# Patient Record
Sex: Male | Born: 1935 | Race: White | Hispanic: No | Marital: Married | Smoking: Former smoker
Health system: Southern US, Community
[De-identification: ages and names within clinical notes are randomized; demographics above are authoritative.]

## PROBLEM LIST (undated history)

## (undated) DIAGNOSIS — I2510 Atherosclerotic heart disease of native coronary artery without angina pectoris: Secondary | ICD-10-CM

## (undated) DIAGNOSIS — C61 Malignant neoplasm of prostate: Secondary | ICD-10-CM

## (undated) DIAGNOSIS — K279 Peptic ulcer, site unspecified, unspecified as acute or chronic, without hemorrhage or perforation: Secondary | ICD-10-CM

## (undated) DIAGNOSIS — F17201 Nicotine dependence, unspecified, in remission: Secondary | ICD-10-CM

## (undated) DIAGNOSIS — E785 Hyperlipidemia, unspecified: Secondary | ICD-10-CM

## (undated) DIAGNOSIS — J449 Chronic obstructive pulmonary disease, unspecified: Secondary | ICD-10-CM

## (undated) DIAGNOSIS — I10 Essential (primary) hypertension: Secondary | ICD-10-CM

## (undated) HISTORY — DX: Nicotine dependence, unspecified, in remission: F17.201

## (undated) HISTORY — DX: Essential (primary) hypertension: I10

## (undated) HISTORY — DX: Hyperlipidemia, unspecified: E78.5

## (undated) HISTORY — DX: Atherosclerotic heart disease of native coronary artery without angina pectoris: I25.10

## (undated) HISTORY — DX: Peptic ulcer, site unspecified, unspecified as acute or chronic, without hemorrhage or perforation: K27.9

## (undated) HISTORY — DX: Chronic obstructive pulmonary disease, unspecified: J44.9

## (undated) HISTORY — DX: Malignant neoplasm of prostate: C61

---

## 1968-03-10 DIAGNOSIS — K279 Peptic ulcer, site unspecified, unspecified as acute or chronic, without hemorrhage or perforation: Secondary | ICD-10-CM

## 1968-03-10 HISTORY — PX: REPAIR OF PERFORATED ULCER: SHX6065

## 1968-03-10 HISTORY — DX: Peptic ulcer, site unspecified, unspecified as acute or chronic, without hemorrhage or perforation: K27.9

## 1996-03-10 HISTORY — PX: CORONARY ARTERY BYPASS GRAFT: SHX141

## 2001-01-26 ENCOUNTER — Other Ambulatory Visit: Admission: RE | Admit: 2001-01-26 | Discharge: 2001-01-26 | Payer: Self-pay | Admitting: Urology

## 2001-03-10 DIAGNOSIS — C61 Malignant neoplasm of prostate: Secondary | ICD-10-CM

## 2001-03-10 HISTORY — DX: Malignant neoplasm of prostate: C61

## 2001-03-26 ENCOUNTER — Ambulatory Visit: Admission: RE | Admit: 2001-03-26 | Discharge: 2001-06-24 | Payer: Self-pay | Admitting: Radiation Oncology

## 2002-08-16 ENCOUNTER — Encounter: Payer: Self-pay | Admitting: Family Medicine

## 2002-08-16 ENCOUNTER — Ambulatory Visit (HOSPITAL_COMMUNITY): Admission: RE | Admit: 2002-08-16 | Discharge: 2002-08-16 | Payer: Self-pay | Admitting: Family Medicine

## 2002-08-22 ENCOUNTER — Ambulatory Visit (HOSPITAL_COMMUNITY): Admission: RE | Admit: 2002-08-22 | Discharge: 2002-08-22 | Payer: Self-pay | Admitting: Family Medicine

## 2002-08-22 ENCOUNTER — Encounter: Payer: Self-pay | Admitting: Family Medicine

## 2002-09-27 ENCOUNTER — Ambulatory Visit (HOSPITAL_COMMUNITY): Admission: RE | Admit: 2002-09-27 | Discharge: 2002-09-27 | Payer: Self-pay | Admitting: Internal Medicine

## 2002-09-27 HISTORY — PX: COLONOSCOPY: SHX174

## 2002-12-07 ENCOUNTER — Ambulatory Visit (HOSPITAL_COMMUNITY): Admission: RE | Admit: 2002-12-07 | Discharge: 2002-12-07 | Payer: Self-pay | Admitting: Family Medicine

## 2002-12-07 ENCOUNTER — Encounter: Payer: Self-pay | Admitting: Family Medicine

## 2003-05-09 ENCOUNTER — Ambulatory Visit (HOSPITAL_COMMUNITY): Admission: RE | Admit: 2003-05-09 | Discharge: 2003-05-09 | Payer: Self-pay | Admitting: Family Medicine

## 2003-05-24 ENCOUNTER — Encounter: Payer: Self-pay | Admitting: Cardiology

## 2003-05-26 ENCOUNTER — Ambulatory Visit (HOSPITAL_COMMUNITY): Admission: RE | Admit: 2003-05-26 | Discharge: 2003-05-26 | Payer: Self-pay | Admitting: *Deleted

## 2003-06-12 ENCOUNTER — Ambulatory Visit (HOSPITAL_COMMUNITY): Admission: RE | Admit: 2003-06-12 | Discharge: 2003-06-12 | Payer: Self-pay | Admitting: *Deleted

## 2003-06-23 ENCOUNTER — Encounter (HOSPITAL_COMMUNITY): Admission: RE | Admit: 2003-06-23 | Discharge: 2003-06-30 | Payer: Self-pay | Admitting: Family Medicine

## 2003-09-01 ENCOUNTER — Ambulatory Visit (HOSPITAL_COMMUNITY): Admission: RE | Admit: 2003-09-01 | Discharge: 2003-09-01 | Payer: Self-pay | Admitting: Family Medicine

## 2004-01-01 ENCOUNTER — Ambulatory Visit (HOSPITAL_COMMUNITY): Admission: RE | Admit: 2004-01-01 | Discharge: 2004-01-01 | Payer: Self-pay | Admitting: Family Medicine

## 2004-06-13 ENCOUNTER — Ambulatory Visit (HOSPITAL_COMMUNITY): Admission: RE | Admit: 2004-06-13 | Discharge: 2004-06-13 | Payer: Self-pay | Admitting: Family Medicine

## 2005-02-06 ENCOUNTER — Ambulatory Visit: Payer: Self-pay | Admitting: *Deleted

## 2005-02-20 ENCOUNTER — Ambulatory Visit: Payer: Self-pay | Admitting: Cardiology

## 2006-02-24 ENCOUNTER — Ambulatory Visit: Payer: Self-pay | Admitting: Cardiology

## 2007-03-15 ENCOUNTER — Ambulatory Visit: Payer: Self-pay | Admitting: Cardiology

## 2007-11-26 ENCOUNTER — Encounter: Payer: Self-pay | Admitting: Cardiology

## 2007-11-26 LAB — CONVERTED CEMR LAB
BUN: 11 mg/dL
CO2: 24 meq/L
Chloride: 101 meq/L
Cholesterol: 125 mg/dL
Creatinine, Ser: 0.76 mg/dL
Glucose, Bld: 101 mg/dL
HCT: 44.2 %
HDL: 37 mg/dL
Hemoglobin: 15.1 g/dL
LDL Cholesterol: 69 mg/dL
MCV: 91 fL
Platelets: 211 10*3/uL
Potassium: 5.2 meq/L
Sodium: 135 meq/L
Total Bilirubin: 0.8 mg/dL
Triglyceride fasting, serum: 95 mg/dL
WBC: 8.3 10*3/uL

## 2008-04-12 ENCOUNTER — Ambulatory Visit: Payer: Self-pay | Admitting: Cardiology

## 2008-05-01 ENCOUNTER — Emergency Department (HOSPITAL_COMMUNITY): Admission: EM | Admit: 2008-05-01 | Discharge: 2008-05-01 | Payer: Self-pay | Admitting: Emergency Medicine

## 2009-01-12 ENCOUNTER — Ambulatory Visit (HOSPITAL_COMMUNITY): Admission: RE | Admit: 2009-01-12 | Discharge: 2009-01-12 | Payer: Self-pay | Admitting: Family Medicine

## 2009-01-12 LAB — CONVERTED CEMR LAB
ALT: 19 units/L
AST: 22 units/L
Albumin: 4.4 g/dL
Alkaline Phosphatase: 83 units/L
BUN: 9 mg/dL
CO2: 23 meq/L
Calcium: 9.4 mg/dL
Chloride: 102 meq/L
Cholesterol: 133 mg/dL
Creatinine, Ser: 0.71 mg/dL
Glucose, Bld: 87 mg/dL
HCT: 45.1 %
HDL: 40 mg/dL
Hemoglobin: 14.7 g/dL
LDL Cholesterol: 71 mg/dL
MCV: 94.5 fL
Platelets: 259 10*3/uL
Potassium: 4.9 meq/L
Sodium: 139 meq/L
TSH: 2.324 microintl units/mL
Total Protein: 6.7 g/dL
Triglycerides: 110 mg/dL
WBC: 8.7 10*3/uL

## 2009-05-09 ENCOUNTER — Telehealth (INDEPENDENT_AMBULATORY_CARE_PROVIDER_SITE_OTHER): Payer: Self-pay

## 2009-05-21 ENCOUNTER — Encounter: Payer: Self-pay | Admitting: Cardiology

## 2009-05-21 DIAGNOSIS — J449 Chronic obstructive pulmonary disease, unspecified: Secondary | ICD-10-CM

## 2009-05-21 DIAGNOSIS — I2510 Atherosclerotic heart disease of native coronary artery without angina pectoris: Secondary | ICD-10-CM | POA: Insufficient documentation

## 2009-05-21 DIAGNOSIS — J4489 Other specified chronic obstructive pulmonary disease: Secondary | ICD-10-CM | POA: Insufficient documentation

## 2009-05-22 ENCOUNTER — Ambulatory Visit: Payer: Self-pay | Admitting: Cardiology

## 2010-01-16 ENCOUNTER — Encounter: Payer: Self-pay | Admitting: *Deleted

## 2010-01-16 LAB — CONVERTED CEMR LAB
ALT: 19 units/L
AST: 22 units/L
Albumin: 4.4 g/dL
Alkaline Phosphatase: 82 units/L
BUN: 9 mg/dL
Basophils Absolute: 0.1 10*3/uL
Basophils Relative: 1 %
CO2: 26 meq/L
Calcium: 10.1 mg/dL
Chloride: 104 meq/L
Cholesterol: 130 mg/dL
Creatinine, Ser: 0.8 mg/dL
Eosinophils Absolute: 0.6 10*3/uL
Eosinophils Relative: 7 %
GFR calc non Af Amer: >60 mL/min
Glomerular Filtration Rate, Af Am: >60 mL/min/{1.73_m2}
Glucose, Bld: 99 mg/dL
HCT: 44.1 %
HDL: 44 mg/dL
Hemoglobin: 14.5 g/dL
LDL Cholesterol: 69 mg/dL
Lymphocytes Relative: 19 %
Lymphs Abs: 1.6 10*3/uL
MCHC: 32.9 g/dL
MCV: 94 fL
Monocytes Absolute: 0.6 10*3/uL
Monocytes Relative: 7 %
Platelets: 262 10*3/uL
Potassium: 5.5 meq/L
RBC: 4.69 M/uL
RDW: 14.2 %
Sodium: 139 meq/L
Total Protein: 7 g/dL
Triglycerides: 83 mg/dL
WBC: 8.6 10*3/uL

## 2010-04-09 NOTE — Assessment & Plan Note (Signed)
Summary: 1 YR F/U PER CHECKOUT ON 04/12/08/TG  Medications Added METOPROLOL TARTRATE 100 MG TABS (METOPROLOL TARTRATE) take 1/2 tab two times a day LIPITOR 40 MG TABS (ATORVASTATIN CALCIUM) take 1 tab daily DAILY MULTI  TABS (MULTIPLE VITAMINS-MINERALS) take 1 tab daily ASPIR-LOW 81 MG TBEC (ASPIRIN) take 1 tab daily PROAIR HFA 108 (90 BASE) MCG/ACT AERS (ALBUTEROL SULFATE) uas as needed ADVAIR DISKUS 100-50 MCG/DOSE AEPB (FLUTICASONE-SALMETEROL) use as needed KLOR-CON 10 10 MEQ CR-TABS (POTASSIUM CHLORIDE) take prn FUROSEMIDE 20 MG TABS (FUROSEMIDE) take prn ALPRAZOLAM 0.5 MG TABS (ALPRAZOLAM) take as needed      Allergies Added: NKDA  Visit Type:  Follow-up Primary Provider:  Dr. Nobie Putnam   History of Present Illness: Return visit for this very pleasant 77 year old gentleman with coronary disease and multiple cardiovascular risk factors.  Over the past 12 months, he has developed no significant medical issues.  He has not been evaluated in the emergency department nor has he required hospitalization.  Routine laboratory studies have been performed in Dr. Geanie Logan office-we are seeking the results of those tests.  Mr. Tanzi experiences cold-induced asthma and thus is less active in the winter.  He denies chest discomfort or dyspnea outside of the times he is wheezing.  He has had no orthopnea nor PND.  He notes no pedal edema.   Current Medications (verified): 1)  Lisinopril 20 Mg Tabs (Lisinopril) .... Take 1 Tablet By Mouth Once Daily 2)  Metoprolol Tartrate 100 Mg Tabs (Metoprolol Tartrate) .... Take 1/2 Tab Two Times A Day 3)  Lipitor 40 Mg Tabs (Atorvastatin Calcium) .... Take 1 Tab Daily 4)  Daily Multi  Tabs (Multiple Vitamins-Minerals) .... Take 1 Tab Daily 5)  Aspir-Low 81 Mg Tbec (Aspirin) .... Take 1 Tab Daily 6)  Proair Hfa 108 (90 Base) Mcg/act Aers (Albuterol Sulfate) .... Uas As Needed 7)  Advair Diskus 100-50 Mcg/dose Aepb (Fluticasone-Salmeterol) .... Use As  Needed 8)  Klor-Con 10 10 Meq Cr-Tabs (Potassium Chloride) .... Take Prn 9)  Furosemide 20 Mg Tabs (Furosemide) .... Take Prn 10)  Alprazolam 0.5 Mg Tabs (Alprazolam) .... Take As Needed  Allergies (verified): No Known Drug Allergies  Past History:  PMH, FH, and Social History reviewed and updated.  Review of Systems  The patient denies anorexia, fever, weight loss, weight gain, vision loss, decreased hearing, hoarseness, chest pain, syncope, dyspnea on exertion, peripheral edema, prolonged cough, headaches, hemoptysis, abdominal pain, melena, and hematochezia.    Vital Signs:  Patient profile:   77 year old male Height:      67 inches Weight:      143 pounds BMI:     22.48 Pulse rate:   45 / minute BP sitting:   113 / 64  (right arm)  Vitals Entered By: Dreama Saa, CNA (May 22, 2009 2:10 PM)  Physical Exam  General:    Trim and well developed; no acute distress:   Neck-No JVD; no carotid bruits: Lungs-No tachypnea, no rales; no rhonchi; no wheezes: Cardiovascular-normal PMI; normal S1 and S2; S4 present Abdomen-BS normal; soft and non-tender without masses or organomegaly:  Musculoskeletal-No deformities, no cyanosis or clubbing: Neurologic-Normal cranial nerves; symmetric strength and tone:  Skin-Warm, dry, no significant lesions: Extremities-Nl distal pulses; no edema:     Impression & Recommendations:  Problem # 1:  ATHEROSCLEROTIC CARDIOVASCULAR DISEASE (ICD-429.2) No symptoms to suggest myocardial ischemia.  Management will continue to center on optimal control of risk factors.  Problem # 2:  HYPERLIPIDEMIA (ICD-272.4) The most recent lipid  profile available to me was performed 5 months ago and was excellent.  Total cholesterol was 133, triglycerides 110, HDL 40 and LDL 71.  A CBC and complete metabolic profile were also normal at that time as was TSH and PSA.  Problem # 3:  CHRONIC OBSTRUCTIVE PULMONARY DISEASE (ICD-496) Patient has a component of asthma  responsive to Advair.  He is encouraged to remain as active as possible, perhaps exercising indoors during cold weather.  He assures me that he will be much more active as the spring progresses.  I will reassess this nice gentleman in one year.  Patient Instructions: 1)  Your physician recommends that you schedule a follow-up appointment in: 1 YEAR

## 2010-04-09 NOTE — Progress Notes (Signed)
Summary: Refill   Phone Note Call from Patient   Caller: Patient Reason for Call: Refill Medication Summary of Call: pt wants refill for Lisinopril 20mg  once daily 90 day supply called to [facility] R[facility]/tg Initial call taken by: Raechel Ache [facility],  May 09, 2009 11:00 AM    Prescriptions: LISINOPRIL 20 MG TABS (LISINOPRIL) take 1 tablet by mouth once daily  #90 x 0   Entered by:   Larita Fife Via LPN   Authorized by:   Kathlen Brunswick, MD, [facility]   Signed by:   Larita Fife Via LPN on 91/47/8295   Method used:   Electronically to        [facility]  [address] (retail)       [address]       [place], [place]  [address]       Ph: [phone]       Fax: [phone]   RxID:   [id number]

## 2010-05-27 ENCOUNTER — Ambulatory Visit (INDEPENDENT_AMBULATORY_CARE_PROVIDER_SITE_OTHER): Payer: Medicare Other | Admitting: Cardiology

## 2010-05-27 ENCOUNTER — Encounter: Payer: Self-pay | Admitting: Cardiology

## 2010-05-27 ENCOUNTER — Encounter: Payer: Self-pay | Admitting: *Deleted

## 2010-05-27 DIAGNOSIS — E875 Hyperkalemia: Secondary | ICD-10-CM | POA: Insufficient documentation

## 2010-05-27 DIAGNOSIS — I498 Other specified cardiac arrhythmias: Secondary | ICD-10-CM

## 2010-05-27 DIAGNOSIS — I2510 Atherosclerotic heart disease of native coronary artery without angina pectoris: Secondary | ICD-10-CM

## 2010-05-27 DIAGNOSIS — I10 Essential (primary) hypertension: Secondary | ICD-10-CM

## 2010-05-27 DIAGNOSIS — E782 Mixed hyperlipidemia: Secondary | ICD-10-CM

## 2010-05-28 ENCOUNTER — Ambulatory Visit (INDEPENDENT_AMBULATORY_CARE_PROVIDER_SITE_OTHER): Payer: Medicare Other | Admitting: Urology

## 2010-05-28 DIAGNOSIS — C61 Malignant neoplasm of prostate: Secondary | ICD-10-CM

## 2010-05-28 DIAGNOSIS — R310 Gross hematuria: Secondary | ICD-10-CM

## 2010-05-28 DIAGNOSIS — N3000 Acute cystitis without hematuria: Secondary | ICD-10-CM

## 2010-05-28 DIAGNOSIS — N433 Hydrocele, unspecified: Secondary | ICD-10-CM

## 2010-06-06 NOTE — Letter (Signed)
Summary: [facility], [facility] at [employer]  [address] [place] [address]   Phone: [phone]  Fax: [phone]     May 27, 2010 MRN: [id number]   [facility] [address] [place], [place]  [address]      YOUR LAB WORK IS DUE   June 27, 2010  Please go to [facility], located across the street from [facility] on the second floor.  Hours are Monday - Friday 7am until 7:30pm         Saturday 8am until 12noon      _X_ YOUR LABWORK IS NOT FASTING --YOU MAY EAT PRIOR TO LABWORK

## 2010-06-06 NOTE — Assessment & Plan Note (Signed)
Summary: 1 YEAR F/U PER PT CHECK OUT/TMJ/TR  Medications Added METOPROLOL TARTRATE 25 MG TABS (METOPROLOL TARTRATE) Take one tablet by mouth twice a day      Allergies Added: NKDA  Visit Type:  Follow-up Primary Provider:  Dr. Karleen Hampshire   History of Present Illness: Micheal Hawkins returns to the office for continued assessment and treatment of multiple cardiovascular risk factors.  He has done well over the past year with good exercise tolerance and no cardiopulmonary symptoms.  He has had no new medical problems, no emergency department visits and no hospitalizations.  He has been told of the presence of cataracts and the possible need for surgery in the near future.  Current Medications (verified): 1)  Lisinopril 20 Mg Tabs (Lisinopril) .... Take 1 Tablet By Mouth Once Daily 2)  Metoprolol Tartrate 25 Mg Tabs (Metoprolol Tartrate) .... Take One Tablet By Mouth Twice A Day 3)  Lipitor 40 Mg Tabs (Atorvastatin Calcium) .... Take 1 Tab Daily 4)  Daily Multi  Tabs (Multiple Vitamins-Minerals) .... Take 1 Tab Daily 5)  Aspir-Low 81 Mg Tbec (Aspirin) .... Take 1 Tab Daily 6)  Proair Hfa 108 (90 Base) Mcg/act Aers (Albuterol Sulfate) .... Uas As Needed 7)  Advair Diskus 100-50 Mcg/dose Aepb (Fluticasone-Salmeterol) .... Use As Needed 8)  Klor-Con 10 10 Meq Cr-Tabs (Potassium Chloride) .... Take Prn 9)  Furosemide 20 Mg Tabs (Furosemide) .... Take Prn 10)  Alprazolam 0.5 Mg Tabs (Alprazolam) .... Take As Needed  Allergies (verified): No Known Drug Allergies  Comments:  Nurse/Medical Assistant: patient brought med list we reviewed [facility] in [place]  Past History:  PMH, FH, and Social History reviewed and updated.  Review of Systems  The patient denies weight loss, weight gain, hoarseness, chest pain, syncope, dyspnea on exertion, peripheral edema, prolonged cough, headaches, and abdominal pain.    Vital Signs:  Patient profile:   77 year old male Weight:      145  pounds BMI:     22.79 O2 Sat:      92 % on Room air Pulse rate:   48 / minute BP sitting:   114 / 61  (left arm)  Vitals Entered By: Dreama Saa, CNA (May 27, 2010 12:59 PM)  O2 Flow:  Room air  Physical Exam  General:    Trim and well developed; no acute distress:   Neck-No JVD; no carotid bruits: Lungs-No tachypnea, no rales; no rhonchi; no wheezes: Cardiovascular-normal PMI; normal S1 and S2; S4 present; regular rhythm with bradycardia Abdomen-BS normal; soft and non-tender without masses or organomegaly:  Musculoskeletal-No deformities, no cyanosis or clubbing: Neurologic-Normal cranial nerves; symmetric strength and tone:  Skin-Warm, dry, no significant lesions: Extremities-Nl distal pulses; no edema:     EKG  Procedure date:  05/27/2010  Findings:      Rhythm Strip  Sinus bradycardia at a rate of 46 bpm Sinus arrhythmia   Impression & Recommendations:  Problem # 1:  ATHEROSCLEROTIC CARDIOVASCULAR DISEASE (ICD-429.2) Patient is doing very well with respect to coronary disease, now more than 20 years post CABG surgery.  We will continue what has been a winning strategy of maximizing medical management of cardiovascular risk factors.  Due to the presence of fairly profound sinus bradycardia, dose of metoprolol will be halved.  If bradycardia persists, this medication can be discontinued entirely.  Problem # 2:  HYPERLIPIDEMIA (ICD-272.4) Lipid profile is excellent; current medication will be continued.  CHOL: 130 (01/16/2010)   LDL: 69 (01/16/2010)  HDL: 44 (01/16/2010)   TG: 83 (01/16/2010)  Problem # 3:  HYPERKALEMIA (ICD-276.7) Recent potassium was 5.5.  While this may be a lab error or related to hemolysis, a low potassium diet will be recommended and a chemistry profile reassessed in one month.  I will see this nice gentleman again in one year.  Other Orders: Future Orders: T-Basic Metabolic Panel [phone]) ... 06/27/2010  Patient  Instructions: 1)  Your physician recommends that you schedule a follow-up appointment in: 1 YEAR 2)  Your physician recommends that you return for lab work in: 1 MONTH 3)  Your physician has recommended you make the following change in your medication: DECREASE METOPROLOL TO 25MG  TWICE DAILY  4)  Your physician has requested that you decrease the amount of potassium in your diet. Please see [facility] handout. Prescriptions: METOPROLOL TARTRATE 25 MG TABS (METOPROLOL TARTRATE) Take one tablet by mouth twice a day  #60 x 3   Entered by:   Teressa Lower RN   Authorized by:   Kathlen Brunswick, MD, [facility]   Signed by:   Teressa Lower RN on 05/27/2010   Method used:   Electronically to        [facility]  [address] (retail)       [address]       [place], [place]  [address]       Ph: [phone]       Fax: [phone]   RxID:   [id number]

## 2010-06-06 NOTE — Miscellaneous (Signed)
Summary: labs cmp,cbcd,lipids,01/16/2010  Clinical Lists Changes  Observations: Added new observation of CALCIUM: 10.1 mg/dL (16/12/9602 5:40) Added new observation of GFR AA: >60 mL/min/1.55m2 (01/16/2010 7:15) Added new observation of GFR: >60 mL/min (01/16/2010 7:15) Added new observation of LDL: 69 mg/dL (98/01/9146 8:29) Added new observation of HDL: 44 mg/dL (56/21/3086 5:78) Added new observation of TRIGLYC TOT: 83 mg/dL (46/96/2952 8:41) Added new observation of CHOLESTEROL: 130 mg/dL (32/44/0102 7:25) Added new observation of ALBUMIN: 4.4 g/dL (36/64/4034 7:42) Added new observation of PROTEIN, TOT: 7.0 g/dL (59/56/3875 6:43) Added new observation of SGPT (ALT): 19 units/L (01/16/2010 7:15) Added new observation of SGOT (AST): 22 units/L (01/16/2010 7:15) Added new observation of ALK PHOS: 82 units/L (01/16/2010 7:15) Added new observation of CREATININE: 0.80 mg/dL (32/95/1884 1:66) Added new observation of BUN: 9 mg/dL (09/07/1599 0:93) Added new observation of BG RANDOM: 99 mg/dL (23/55/7322 0:25) Added new observation of CO2 PLSM/SER: 26 meq/L (01/16/2010 7:15) Added new observation of CL SERUM: 104 meq/L (01/16/2010 7:15) Added new observation of K SERUM: 5.5 meq/L (01/16/2010 7:15) Added new observation of NA: 139 meq/L (01/16/2010 7:15) Added new observation of ABSOLUTE BAS: 0.1 K/uL (01/16/2010 7:15) Added new observation of BASOPHIL %: 1 % (01/16/2010 7:15) Added new observation of EOS ABSLT: 0.6 K/uL (01/16/2010 7:15) Added new observation of % EOS AUTO: 7 % (01/16/2010 7:15) Added new observation of ABSOLUTE MON: 0.6 K/uL (01/16/2010 7:15) Added new observation of MONOCYTE %: 7 % (01/16/2010 7:15) Added new observation of ABS LYMPHOCY: 1.6 K/uL (01/16/2010 7:15) Added new observation of LYMPHS %: 19 % (01/16/2010 7:15) Added new observation of PLATELETK/UL: 262 K/uL (01/16/2010 7:15) Added new observation of RDW: 14.2 % (01/16/2010 7:15) Added new observation of  MCHC RBC: 32.9 g/dL (42/70/6237 6:28) Added new observation of MCV: 94.0 fL (01/16/2010 7:15) Added new observation of HCT: 44.1 % (01/16/2010 7:15) Added new observation of HGB: 14.5 g/dL (31/51/7616 0:73) Added new observation of RBC M/UL: 4.69 M/uL (01/16/2010 7:15) Added new observation of WBC COUNT: 8.6 10*3/microliter (01/16/2010 7:15)

## 2010-06-27 ENCOUNTER — Other Ambulatory Visit: Payer: Self-pay | Admitting: Cardiology

## 2010-06-27 LAB — BASIC METABOLIC PANEL
BUN: 10 mg/dL (ref 6–23)
CO2: 24 mEq/L (ref 19–32)
Calcium: 10.2 mg/dL (ref 8.4–10.5)
Chloride: 104 mEq/L (ref 96–112)
Creat: 0.75 mg/dL (ref 0.40–1.50)
Glucose, Bld: 103 mg/dL — ABNORMAL HIGH (ref 70–99)
Potassium: 5.7 mEq/L — ABNORMAL HIGH (ref 3.5–5.3)
Sodium: 139 mEq/L (ref 135–145)

## 2010-07-02 ENCOUNTER — Encounter (HOSPITAL_COMMUNITY): Payer: Medicare Other

## 2010-07-02 ENCOUNTER — Other Ambulatory Visit: Payer: Self-pay | Admitting: Ophthalmology

## 2010-07-02 LAB — BASIC METABOLIC PANEL
BUN: 8 mg/dL (ref 6–23)
CO2: 27 mEq/L (ref 19–32)
Calcium: 9 mg/dL (ref 8.4–10.5)
Chloride: 102 mEq/L (ref 96–112)
Creatinine, Ser: 0.69 mg/dL (ref 0.4–1.5)
GFR calc Af Amer: >60 mL/min (ref >60)
GFR calc non Af Amer: >60 mL/min (ref >60)
Glucose, Bld: 101 mg/dL — ABNORMAL HIGH (ref 70–99)
Potassium: 4.5 mEq/L (ref 3.5–5.1)
Sodium: 134 mEq/L — ABNORMAL LOW (ref 135–145)

## 2010-07-02 LAB — HEMOGLOBIN AND HEMATOCRIT, BLOOD
HCT: 39.6 % (ref 39.0–52.0)
Hemoglobin: 13.4 g/dL (ref 13.0–17.0)

## 2010-07-08 ENCOUNTER — Ambulatory Visit (HOSPITAL_COMMUNITY)
Admission: RE | Admit: 2010-07-08 | Discharge: 2010-07-08 | Disposition: A | Discharge status: Home / Self Care | Payer: Medicare Other | Source: Ambulatory Visit | Attending: Ophthalmology | Admitting: Ophthalmology

## 2010-07-08 DIAGNOSIS — H2510 Age-related nuclear cataract, unspecified eye: Secondary | ICD-10-CM | POA: Insufficient documentation

## 2010-07-08 DIAGNOSIS — Z79899 Other long term (current) drug therapy: Secondary | ICD-10-CM | POA: Insufficient documentation

## 2010-07-08 DIAGNOSIS — Z7982 Long term (current) use of aspirin: Secondary | ICD-10-CM | POA: Insufficient documentation

## 2010-07-08 DIAGNOSIS — Z951 Presence of aortocoronary bypass graft: Secondary | ICD-10-CM | POA: Insufficient documentation

## 2010-07-08 DIAGNOSIS — J4489 Other specified chronic obstructive pulmonary disease: Secondary | ICD-10-CM | POA: Insufficient documentation

## 2010-07-08 DIAGNOSIS — I10 Essential (primary) hypertension: Secondary | ICD-10-CM | POA: Insufficient documentation

## 2010-07-08 DIAGNOSIS — J449 Chronic obstructive pulmonary disease, unspecified: Secondary | ICD-10-CM | POA: Insufficient documentation

## 2010-07-09 ENCOUNTER — Telehealth: Payer: Self-pay | Admitting: *Deleted

## 2010-07-09 ENCOUNTER — Encounter: Payer: Self-pay | Admitting: *Deleted

## 2010-07-09 DIAGNOSIS — E875 Hyperkalemia: Secondary | ICD-10-CM

## 2010-07-09 MED ORDER — LISINOPRIL 5 MG PO TABS: 5.0000 mg | ORAL_TABLET | Freq: Every day | ORAL | Status: DC

## 2010-07-09 NOTE — Telephone Encounter (Signed)
I spoke with pt and spouse ,pt is HOH, gave instructions on medicaitons changes, labs ordered , pt and spouse  Verbalized understanding

## 2010-07-09 NOTE — Telephone Encounter (Signed)
Message copied by Teressa Lower on Tue Jul 09, 2010  8:39 AM ------      Message from: Elida Bing      Created: Thu Jul 04, 2010 10:56 PM       Patient had significant hyperkalemia, which has been progressive over the last few years.      He is to take none of the potassium supplement that he was previously prescribed.      Decrease lisinopril to 5 mg q.d.      Patient to monitor blood pressure and report repeated or significant elevations above 140 systolic or 90 diastolic.      Repeat BMet in one month.

## 2010-07-23 NOTE — Letter (Signed)
April 12, 2008    Patrica Duel, MD  [address]  [place], [place] [address]   RE:  Micheal, Hawkins  MRN:  [id number]  /  DOB:  14-Feb-1933   Dear Loraine Leriche,   Micheal Hawkins returns to the office for continued assessment and  treatment of coronary artery disease and cardiovascular risk factors.  Since his last visit, he has done superbly.  He does all of his yard  work and tree work during the summer and did some snow shoveling during  our recent storm.  He has no dyspnea nor chest discomfort.  He also  stays busy with craft work, fabricating small items that he typically  gives away.   CURRENT MEDICATIONS:  1. Aspirin 81 mg daily.  2. Metoprolol 50 mg b.i.d.  3. Atorvastatin 40 mg daily.  4. Lisinopril 20 mg daily.  5. He uses albuterol and Advair on a p.r.n. basis.   PHYSICAL EXAMINATION:  GENERAL:  Trim, pleasant gentleman who is  obviously hard of hearing and in no acute distress.  VITAL SIGNS:  The weight is 145, unchanged.  Blood pressure 100/60,  heart rate 55 and regular, respirations 14.  NECK:  No jugular venous distention; no carotid bruits.  LUNGS:  Clear.  CARDIAC:  Normal first and second heart sounds, best heard in the  epigastrium; fourth heart sound present.  ABDOMEN:  Soft and nontender; no masses; no organomegaly.  EXTREMITIES:  Distal pulses intact; no edema.   EKG:  Sinus bradycardia with a single PVC; left atrial abnormality;  delayed R-wave progression; nonspecific T-wave abnormality.  Comparison  with prior tracing of May 24, 2003:  No significant interval change.   LABORATORY DATA:  The reports of recent laboratory studies were  obtained.  CBC, chemistry profile, PSA, and LFTs were normal.  Hepatic  profile was excellent with a total cholesterol of 125, triglycerides of  95, HDL 37, and LDL of 69.   IMPRESSION:  Micheal Hawkins continues to do well, now 11 years following  CABG surgery.  He will continue his current medication.  I  suggested he  also try fish oil on a b.i.d. basis.  I will see him again in 1 year.    Sincerely,      Gerrit Friends. Dietrich Pates, MD, [facility]  Electronically Signed    RMR/MedQ  DD: 04/12/2008  DT: 04/13/2008  Job #: [id number]

## 2010-07-23 NOTE — Letter (Signed)
March 15, 2007    Patrica Duel, M.D.  [address]  [place], [place] [address]   RE:  CHAIM, GATLEY  MRN:  [id number]  /  DOB:  [birth year]   Dear Loraine Leriche:   Mr. Dwan returns the office for continued assessment and treatment  of coronary disease and cardiovascular risk factors.  Since last visit  slightly more than year ago, he has done well from a cardiac standpoint.  A few months ago, he developed increased wheezing prompting  intensification of his therapy for asthma.  He uses Advair on an  essentially p.r.n. basis, which has worked well for him.   OTHER MEDICATIONS:  1. Include metoprolol 50 mg b.i.d.  2. Atorvastatin 40 mg daily.  3. Lisinopril 20 mg daily.  4. Aspirin 325 mg daily.   EXAM:  Trim pleasant gentleman in no acute distress.  Weight is 145, 6 pounds less than in December 2007.  Blood pressure  100/60, heart rate 64 and regular, respirations 18.  NECK:  No jugular venous distention; normal carotid upstrokes without  bruits.  LUNGS:  Clear with slightly prolonged expiratory phase.  CARDIAC:  Normal first and second heart sounds; fourth heart sound  present; normal PMI.  ABDOMEN:  Soft and nontender; normal bowel sounds; no organomegaly.  EXTREMITIES:  Normal distal pulses; no edema.   Recent laboratory from your office was excellent including a normal  chemistry profile, normal CBC, and total cholesterol 129 with HDL 38 and  LDL of 72.   IMPRESSION:  Mr. Baig is doing very well, now 10 years following  coronary artery bypass grafting surgery.  I suggested that he could  reduce the aspirin to 81 mg daily.  He is being treated with samples of  atorvastatin, but should his supply be exhausted, he could switch to  simvastatin for reduced expense.  Otherwise, medical therapy is optimal.  I will plan see this nice gentleman again in 1 year.    Sincerely,      Gerrit Friends. Dietrich Pates, MD, [facility]  Electronically Signed    RMR/MedQ  DD:  03/15/2007  DT: 03/15/2007  Job #: [id number]

## 2010-07-26 NOTE — Op Note (Signed)
   NAMEDARREK, LEASURE                        ACCOUNT NO.:  [id number]   MEDICAL RECORD NO.:  [id number]                   PATIENT TYPE:  AMB   LOCATION:  DAY                                  FACILITY:  [facility]   PHYSICIAN:  R. Roetta Sessions, M.D.              DATE OF BIRTH:  [birth year]   DATE OF PROCEDURE:  09/27/2002  DATE OF DISCHARGE:                                 OPERATIVE REPORT   PROCEDURE:  Screening colonoscopy.   INDICATIONS FOR PROCEDURE:  The patient is a 77 year old gentleman devoid of  any lower GI tract symptoms and with no family history of colorectal  neoplasia.  He has never had his colon imaged.  He was sent over by the  courtesy of Dr. Patrica Duel for screening colonoscopy.  This approach has  been discussed with the patient at length.  The potential risks, benefits,  and alternatives have been reviewed and questions answered.  He is  agreeable.  Please see my handwritten H&P for more information.   PROCEDURE:  O2 saturation, blood pressure, pulses, and respirations were  monitored throughout the entire procedure.  Conscious sedation was with  Versed 2 mg IV, Demerol 50 mg IV.  The instrument used was the Olympus video  chip adult colonoscope.   FINDINGS:  Digital rectal examination revealed no abnormalities.   ENDOSCOPIC FINDINGS:  The prep was good.   Rectum:  Examination of the rectal mucosa including retroflex view of the  anal verge revealed no abnormalities.   Colon:  The colonic mucosa was surveyed from the rectosigmoid junction  through the left, transverse, right colon to the area of the appendiceal  orifice, ileocecal valve, and cecum.  These structures were well-seen and  photographed for the record.  The colonic mucosa appeared normal to the  cecum.  From the level of the cecum and ileocecal valve, the scope was  slowly withdrawn.  All previously mentioned mucosal surfaces were again  seen.  No other abnormalities were observed.  The patient  tolerated the  procedure well and was reactive in endoscopy.    IMPRESSION:  1. Normal rectum.  2. Normal colon.   RECOMMENDATIONS:  Repeat colonoscopy in 10 years.                                               Jonathon Bellows, M.D.    RMR/MEDQ  D:  09/27/2002  T:  09/27/2002  Job:  [id number]   cc:   Patrica Duel, M.D.  [address]  [place]  [place] [address]  Fax: [phone]

## 2010-07-26 NOTE — Letter (Signed)
February 24, 2006    Patrica Duel, M.D.  [address]  [place], [place] [address]   RE:  LASHAUN, KRAPF  MRN:  [id number]  /  DOB:  [birth year]   Dear Loraine Leriche:   Mr. Monnier returned to the office for continued assessment and  treatment of coronary disease, now 9 years following CABG surgery.  He  continues to do extremely well with no chest discomfort nor dyspnea.  He  discontinued cigarette use in 1995.  He reliably takes his medications  which include aspirin 325 mg daily, atorvastatin 20 mg daily, metoprolol  50 mg b.i.d., and Avapro 150 mg daily.  He previously took fish oil, but  developed urticaria.   On exam, a trim, pleasant gentleman in no acute distress.  The weight is  151 - stable.  Blood pressure 100/65, heart rate 56 and regular,  respirations 16.  NECK:  No jugular venous distention; normal carotid upstrokes without  bruits.  LUNGS:  High and low pitched rhonchi at the bases; mild wheezing  detected anteriorly.  ABDOMEN:  Soft and nontender; no organomegaly.  EXTREMITIES:  No edema; 1+ distal pulses.   IMPRESSION:  Mr. Brining is doing great from a cardiac standpoint.  A  lipid profile in July was excellent.  Nonetheless, we will increase his  dose of atorvastatin to 40 mg daily with his next prescription, as even  lower LDL levels are desirable.   He has persistent asthma with daily symptoms.  I suggested that he start  using his Advair on a regular basis and his albuterol p.r.n.  He may  require more aggressive therapy than that.   I will plan to see this nice gentleman again in one year.  He was  instructed regarding the symptoms of recurrent ischemia and the need to  call immediately should these develop.  Vaccinations are up to date.    Sincerely,      Gerrit Friends. Dietrich Pates, MD, [facility]  Electronically Signed    RMR/MedQ  DD: 02/24/2006  DT: 02/24/2006  Job #: [id number]

## 2010-07-26 NOTE — Procedures (Signed)
NAMEELEUTERIO, DOLLAR NO.:  [id number]   MEDICAL RECORD NO.:  [id number]                  PATIENT TYPE:   LOCATION:                                       FACILITY:   PHYSICIAN:  Vida Roller, M.D.                DATE OF BIRTH:  1932-07-15   DATE OF PROCEDURE:  06/12/2003  DATE OF DISCHARGE:                                    STRESS TEST   PROCEDURE:  Exercise Cardiolite   INDICATIONS:  This patient is a 77 year old male with known coronary artery  disease, status post CABG in 1998 with preserved LV function.  Now with  recurrent heart failure and occasional chest discomfort.  He had an  echocardiogram done last month revealing a normal ejection fraction.   BASELINE DATA:  EKG reveals sinus bradycardia at 59 beats/minute with  nonspecific ST abnormalities.  Blood pressure is 112/68.   The patient exercised for a total of 6 minutes to Bruce protocol stage 2 and  7.0 METS.  Maximum heart rate was 146 beats/minute which is 96% of predicted  maximum.  Blood pressure was 192/80 which resolved down to 120/70 in  recovery.   EKG revealed a few PVCs and PACs.  No ischemic changes were noted.  The  patient denied any chest discomfort or shortness of breath during exercise.  Test was thought secondary to leg fatigue.   Final images and results are pending MD review.     ________________________________________  ___________________________________________  Jae Dire, P.A. [facility]                      Vida Roller, M.D.   AB/MEDQ  D:  06/12/2003  T:  06/12/2003  Job:  [id number]

## 2010-07-26 NOTE — Procedures (Signed)
Micheal Hawkins, Micheal Hawkins                        ACCOUNT NO.:  [id number]   MEDICAL RECORD NO.:  [id number]                   PATIENT TYPE:  OUT   LOCATION:  RAD                                  FACILITY:  [facility]   PHYSICIAN:  Vida Roller, M.D.                DATE OF BIRTH:  July 07, 1932   DATE OF PROCEDURE:  05/26/2003  DATE OF DISCHARGE:  05/26/2003                                  ECHOCARDIOGRAM   TAPE NUMBER:  [id number], tape count 5502 through 1914.   INDICATION:  This is a 77 year old man with shortness of breath and chest  pain, status post bypass surgery.   TECHNICAL QUALITY:  Adequate.   M-MODE TRACINGS:  1. The aorta is 34 mm.  2. The left atrium is 42 mm.  3. The septum is 11 mm.  4. The posterior wall is 9 mm.  5. LV diastolic dimension is 49 mm.  6. Left ventricular systolic dimension is 35 mm.   2-D AND DOPPLER IMAGING:  1. The left ventricle is normal size with normal wall thickness.  There is     no obvious wall motion abnormality seen. The overall ejection fraction is     estimated at 50-55%.  Diastolic function appears to be mildly impaired by     interrogation of the transmitral Doppler.  2. The right ventricle appears to be top normal in size.  There is evidence     of increased right ventricular systolic pressure estimated at 40 to 45     mmHg by tricuspid regurgitation jet.  3. Both atria are enlarged.  There is no obvious atrial septal defect.  4. The aortic valve is sclerotic with no evidence of stenosis or     regurgitation.  5. The mitral valve is mildly thickened with trace insufficiency.  No     stenosis is seen.  6. The tricuspid valve is morphologically unremarkable with mild     insufficiency.  7. The pulmonic valve appears to have mild insufficiency.  8. The pericardial structures are normal.  9. The ascending aorta is not well seen.  10.      The inferior vena cava is not well seen.      ___________________________________________                                 Vida Roller, M.D.   JH/MEDQ  D:  05/29/2003  T:  05/30/2003  Job:  [id number]

## 2010-08-09 ENCOUNTER — Other Ambulatory Visit: Payer: Self-pay | Admitting: Cardiology

## 2010-08-10 LAB — BASIC METABOLIC PANEL
BUN: 10 mg/dL (ref 6–23)
CO2: 28 mEq/L (ref 19–32)
Calcium: 9.5 mg/dL (ref 8.4–10.5)
Chloride: 104 mEq/L (ref 96–112)
Creat: 0.73 mg/dL (ref 0.50–1.35)
Glucose, Bld: 97 mg/dL (ref 70–99)
Potassium: 4.8 mEq/L (ref 3.5–5.3)
Sodium: 139 mEq/L (ref 135–145)

## 2010-08-15 ENCOUNTER — Encounter: Payer: Self-pay | Admitting: *Deleted

## 2010-09-23 ENCOUNTER — Other Ambulatory Visit: Payer: Self-pay | Admitting: Cardiology

## 2010-10-31 ENCOUNTER — Other Ambulatory Visit: Payer: Self-pay | Admitting: Cardiology

## 2011-01-28 LAB — CBC WITH DIFFERENTIAL/PLATELET: platelet count: 240

## 2011-01-28 LAB — COMPREHENSIVE METABOLIC PANEL
BUN: 11 mg/dL (ref 4–21)
CO2: 24 mmol/L
Creat: 0.71
Glucose: 89 mg/dL
Total Bilirubin: 0.6 mg/dL

## 2011-01-28 LAB — PSA: PSA: 3.99 ng/mL

## 2011-02-04 ENCOUNTER — Encounter: Payer: Self-pay | Admitting: Urgent Care

## 2011-02-04 ENCOUNTER — Ambulatory Visit (INDEPENDENT_AMBULATORY_CARE_PROVIDER_SITE_OTHER): Payer: Medicare Other | Admitting: Urgent Care

## 2011-02-04 VITALS — BP 115/66 | HR 47 | Temp 97.2°F | Ht 67.0 in | Wt 145.4 lb

## 2011-02-04 DIAGNOSIS — R195 Other fecal abnormalities: Secondary | ICD-10-CM | POA: Insufficient documentation

## 2011-02-04 NOTE — Assessment & Plan Note (Signed)
Micheal Hawkins is a 77 y.o. male found to have Hemoccult-positive stool on routine physical exam. He denies any significant GI complaints at this time. Last colonoscopy was 8 years ago. He will need colonoscopy to look for colorectal carcinoma or polyp.  I have discussed risks & benefits which include, but are not limited to, bleeding, infection, perforation & drug reaction.  The patient agrees with this plan & written consent will be obtained.

## 2011-02-04 NOTE — Patient Instructions (Signed)
Colonoscopy as planned.  

## 2011-02-04 NOTE — Progress Notes (Signed)
Referring Provider: Kirk Ruths, MD Primary Care Physician:  Kirk Ruths, MD Primary Gastroenterologist:  Dr. Jena Gauss  Chief Complaint  Patient presents with  . Rectal Bleeding    + stool    HPI:  Micheal Hawkins is a 76 y.o. male here as a referral from Dr. Regino Schultze for Hemoccult-positive stool. He was found to have Hemoccult-positive stool on routine physical exam. There is no history of anemia. History of occasional constipation for which he takes milk of magnesia when necessary. He generally has a BM QOD.  Takes ASA 81mg  daily.  Denies NSAIDs.   Denies any upper GI symptoms including heartburn, indigestion, nausea, vomiting, dysphagia, odynophagia or anorexia.   Denies any diarrhea, rectal bleeding, melena or weight loss.  Reviewed CBC, CMP from 01/28/11 which was normal.  Past Medical History  Diagnosis Date  . Prostate cancer 2003    radiation  . COPD (chronic obstructive pulmonary disease)   . CAD (coronary artery disease)   . Hypertension   . Hyperlipidemia   . CHF (congestive heart failure) 1995  . Asthma     Past Surgical History  Procedure Date  . Coronary artery bypass graft 1998  . Stomach surgery 1970    PUD  . Colonoscopy 09/27/2002    Normal    Current Outpatient Prescriptions  Medication Sig Dispense Refill  . ADVAIR DISKUS 250-50 MCG/DOSE AEPB 1 puff.       . ALPRAZolam (XANAX) 0.5 MG tablet Take 0.5 mg by mouth at bedtime as needed.        Marland Kitchen aspirin 81 MG tablet Take 81 mg by mouth daily.        Marland Kitchen atorvastatin (LIPITOR) 40 MG tablet Take 40 mg by mouth daily.       . furosemide (LASIX) 20 MG tablet Take 20 mg by mouth daily.       Marland Kitchen lisinopril (PRINIVIL,ZESTRIL) 5 MG tablet TAKE ONE TABLET BY MOUTH EVERY DAY ** DISCONTINUE LISINOPRIL 10 MG DAILY **  30 tablet  6  . metoprolol tartrate (LOPRESSOR) 25 MG tablet TAKE ONE TABLET BY MOUTH TWICE DAILY  62 tablet  8  . Multiple Vitamin (MULTIVITAMIN) capsule Take 1 capsule by mouth daily.        .  potassium chloride (K-DUR) 10 MEQ tablet Take 10 mEq by mouth 2 (two) times daily.          Allergies as of 02/04/2011  . (No Known Allergies)    Family History:There is no known family history of colorectal carcinoma , liver disease, or inflammatory bowel disease.  Problem Relation Age of Onset  . Coronary artery disease Mother   . Stroke Mother   . Bone cancer Father   . COPD Sister   . Coronary artery disease Sister     History   Social History  . Marital Status: Married    Spouse Name: N/A    Number of Children: 0  . Years of Education: N/A   Occupational History  . retired Medical laboratory scientific officer    Social History Main Topics  . Smoking status: Former Smoker -- 0.5 packs/day for 20 years    Types: Cigarettes    Quit date: 02/03/1994  . Smokeless tobacco: Former Neurosurgeon    Quit date: 04/05/1993  . Alcohol Use: No  . Drug Use: No  . Sexually Active: Not on file  Review of Systems: Gen: Denies any fever, chills, sweats, anorexia, fatigue, weakness, malaise, weight loss, and sleep disorder CV: Denies chest pain,  angina, palpitations, syncope, orthopnea, PND, peripheral edema, and claudication. Resp: Denies dyspnea at rest, dyspnea with exercise, cough, sputum, wheezing, coughing up blood, and pleurisy. GI: Denies vomiting blood, jaundice, and fecal incontinence.   Denies dysphagia or odynophagia. GU : Denies urinary burning, blood in urine, urinary frequency, urinary hesitancy, nocturnal urination, and urinary incontinence. MS: Denies joint pain, limitation of movement, and swelling, stiffness, low back pain, extremity pain. Denies muscle weakness, cramps, atrophy.  Derm: Denies rash, itching, dry skin, hives, moles, warts, or unhealing ulcers.  Psych: Denies depression, anxiety, memory loss, suicidal ideation, hallucinations, paranoia, and confusion. Heme: Denies bruising, bleeding, and enlarged lymph nodes.  Physical Exam: BP 115/66  Pulse 47  Temp(Src) 97.2 F (36.2 C)  (Temporal)  Ht 5\' 7"  (1.702 m)  Wt 145 lb 6.4 oz (65.953 kg)  BMI 22.77 kg/m2 General:   Alert,  Well-developed, well-nourished, pleasant and cooperative in NAD Head:  Normocephalic and atraumatic. Eyes:  Sclera clear, no icterus.   Conjunctiva pink. Ears:  Hard of hearing.  Normal auditory acuity. Nose:  No deformity, discharge,  or lesions. Mouth:  No deformity or lesions, oropharynx pink & moist. Neck:  Supple; no masses or thyromegaly. Lungs:  Clear throughout to auscultation.   No wheezes, crackles, or rhonchi. No acute distress. Heart:  Regular rate and rhythm; no murmurs, clicks, rubs,  or gallops. Abdomen:  Soft, nontender and nondistended. No masses, hepatosplenomegaly or hernias noted. Normal bowel sounds, without guarding, and without rebound.   Rectal:  Deferred until time of colonoscopy.   Msk:  Symmetrical without gross deformities. Normal posture. Pulses:  Normal pulses noted. Extremities:  Without clubbing or edema. Neurologic:  Alert and  oriented x4;  grossly normal neurologically. Skin:  Intact without significant lesions or rashes. Cervical Nodes:  No significant cervical adenopathy. Psych:  Alert and cooperative. Normal mood and affect.

## 2011-02-07 ENCOUNTER — Encounter: Payer: Self-pay | Admitting: Cardiology

## 2011-02-20 ENCOUNTER — Encounter (HOSPITAL_COMMUNITY): Payer: Self-pay

## 2011-02-25 MED ORDER — SODIUM CHLORIDE 0.45 % IV SOLN
Freq: Once | INTRAVENOUS | Status: AC
  Administered 2011-02-26: 12:00:00 via INTRAVENOUS

## 2011-02-26 ENCOUNTER — Encounter (HOSPITAL_COMMUNITY)
Admission: RE | Disposition: A | Discharge status: Home / Self Care | Payer: Self-pay | Source: Ambulatory Visit | Attending: Internal Medicine

## 2011-02-26 ENCOUNTER — Encounter (HOSPITAL_COMMUNITY): Payer: Self-pay | Admitting: *Deleted

## 2011-02-26 ENCOUNTER — Ambulatory Visit (HOSPITAL_COMMUNITY)
Admission: RE | Admit: 2011-02-26 | Discharge: 2011-02-26 | Disposition: A | Discharge status: Home / Self Care | Payer: Medicare Other | Source: Ambulatory Visit | Attending: Internal Medicine | Admitting: Internal Medicine

## 2011-02-26 DIAGNOSIS — J449 Chronic obstructive pulmonary disease, unspecified: Secondary | ICD-10-CM | POA: Insufficient documentation

## 2011-02-26 DIAGNOSIS — J4489 Other specified chronic obstructive pulmonary disease: Secondary | ICD-10-CM | POA: Insufficient documentation

## 2011-02-26 DIAGNOSIS — Z01812 Encounter for preprocedural laboratory examination: Secondary | ICD-10-CM | POA: Insufficient documentation

## 2011-02-26 DIAGNOSIS — Z79899 Other long term (current) drug therapy: Secondary | ICD-10-CM | POA: Insufficient documentation

## 2011-02-26 DIAGNOSIS — Z7982 Long term (current) use of aspirin: Secondary | ICD-10-CM | POA: Insufficient documentation

## 2011-02-26 DIAGNOSIS — K5730 Diverticulosis of large intestine without perforation or abscess without bleeding: Secondary | ICD-10-CM

## 2011-02-26 DIAGNOSIS — K921 Melena: Secondary | ICD-10-CM | POA: Insufficient documentation

## 2011-02-26 DIAGNOSIS — I10 Essential (primary) hypertension: Secondary | ICD-10-CM | POA: Insufficient documentation

## 2011-02-26 DIAGNOSIS — E785 Hyperlipidemia, unspecified: Secondary | ICD-10-CM | POA: Insufficient documentation

## 2011-02-26 DIAGNOSIS — R195 Other fecal abnormalities: Secondary | ICD-10-CM

## 2011-02-26 HISTORY — PX: COLONOSCOPY: SHX5424

## 2011-02-26 LAB — CBC
HCT: 43.7 % (ref 39.0–52.0)
MCHC: 34.3 g/dL (ref 30.0–36.0)
RDW: 13.8 % (ref 11.5–15.5)
WBC: 6.4 10*3/uL (ref 4.0–10.5)

## 2011-02-26 SURGERY — COLONOSCOPY
Anesthesia: Moderate Sedation

## 2011-02-26 MED ORDER — MEPERIDINE HCL 100 MG/ML IJ SOLN
INTRAMUSCULAR | Status: AC
  Filled 2011-02-26: qty 1

## 2011-02-26 MED ORDER — MEPERIDINE HCL 100 MG/ML IJ SOLN
INTRAMUSCULAR | Status: DC | PRN
  Administered 2011-02-26: 50 mg via INTRAVENOUS

## 2011-02-26 MED ORDER — STERILE WATER FOR IRRIGATION IR SOLN
Status: DC | PRN
  Administered 2011-02-26: 12:00:00

## 2011-02-26 MED ORDER — MIDAZOLAM HCL 5 MG/5ML IJ SOLN
INTRAMUSCULAR | Status: DC | PRN
  Administered 2011-02-26: 2 mg via INTRAVENOUS
  Administered 2011-02-26: 1 mg via INTRAVENOUS

## 2011-02-26 MED ORDER — MIDAZOLAM HCL 5 MG/5ML IJ SOLN
INTRAMUSCULAR | Status: AC
  Filled 2011-02-26: qty 10

## 2011-02-26 NOTE — Op Note (Signed)
[facility] [address] [place], [place]  [address]  COLONOSCOPY PROCEDURE REPORT  PATIENT:  Micheal, Hawkins  MR#:  [id number] BIRTHDATE:  01/26/1933, 78 yrs. old  GENDER:  male ENDOSCOPIST:  R. Roetta Sessions, MD FACP [facility] REF. BY:          Dr. Yetta Numbers PROCEDURE DATE:  02/26/2011 PROCEDURE:  Diagnostic ileocolonoscopy  INDICATIONS:   Hemoccult-positive stool on digital rectal exam/no GI symptoms  INFORMED CONSENT:  The risks, benefits, alternatives and imponderables including but not limited to bleeding, perforation as well as the possibility of a missed lesion have been reviewed. The potential for biopsy, lesion removal, etc. have also been discussed.  Questions have been answered.  All parties agreeable. Please see the history and physical in the medical record for more information.  MEDICATIONS:   Demerol 50 mg IV and Versed 3 mg in divided doses.  DESCRIPTION OF PROCEDURE:  After a digital rectal exam was performed, the EC-[id number] ([id number]) colonoscope was advanced from the anus through the rectum and colon to the area of the cecum, ileocecal valve and appendiceal orifice.  The cecum was deeply intubated.  These structures were well-seen and photographed for the record.  From the level of the cecum and ileocecal valve, the scope was slowly and cautiously withdrawn.  The mucosal surfaces were carefully surveyed utilizing scope tip deflection to facilitate fold flattening as needed.  The scope was pulled down into the rectum where a thorough examination including retroflexion was performed. <<PROCEDUREIMAGES>>  FINDINGS: adequate prep. Sigmoid diverticula ; remainder of rectal and colonic mucosa appeared normal aside from anal papilla. Normal distal 10          of terminal  ileum.  THERAPEUTIC / DIAGNOSTIC MANEUVERS PERFORMED: none  COMPLICATIONS:   none  CECAL WITHDRAWAL TIME: 8 minutes  IMPRESSION:  Single anal  papilla;  otherwise normal  rectum. Sigmoid diverticula; remainder of colon and terminal ileum appeared                                 normal as described above.  RECOMMENDATIONS:   No further GI evaluation warranted at this time as long as the patient is not anemic (we'll check a CBC today)  ______________________________ R. Roetta Sessions, MD Caleen Essex  CC:  Karleen Hampshire, MD  n. eSIGNED:   R. Roetta Sessions at 02/26/2011 12:51 PM  Wells Guiles, [phone]

## 2011-02-26 NOTE — H&P (Signed)
  I have seen & examined the patient prior to the procedure(s) today and reviewed the history and physical/consultation.  There have been no changes.  After consideration of the risks, benefits, alternatives and imponderables, the patient has consented to the procedure(s).   

## 2011-03-13 ENCOUNTER — Encounter (HOSPITAL_COMMUNITY): Payer: Self-pay | Admitting: Internal Medicine

## 2011-05-29 ENCOUNTER — Encounter: Payer: Self-pay | Admitting: Cardiology

## 2011-05-29 ENCOUNTER — Ambulatory Visit (INDEPENDENT_AMBULATORY_CARE_PROVIDER_SITE_OTHER): Payer: Medicare Other | Admitting: Cardiology

## 2011-05-29 VITALS — BP 129/70 | HR 54 | Resp 16 | Ht 67.0 in | Wt 140.0 lb

## 2011-05-29 DIAGNOSIS — E875 Hyperkalemia: Secondary | ICD-10-CM

## 2011-05-29 DIAGNOSIS — R195 Other fecal abnormalities: Secondary | ICD-10-CM

## 2011-05-29 DIAGNOSIS — I10 Essential (primary) hypertension: Secondary | ICD-10-CM

## 2011-05-29 DIAGNOSIS — J449 Chronic obstructive pulmonary disease, unspecified: Secondary | ICD-10-CM

## 2011-05-29 DIAGNOSIS — F17201 Nicotine dependence, unspecified, in remission: Secondary | ICD-10-CM | POA: Insufficient documentation

## 2011-05-29 DIAGNOSIS — C61 Malignant neoplasm of prostate: Secondary | ICD-10-CM | POA: Insufficient documentation

## 2011-05-29 DIAGNOSIS — I2510 Atherosclerotic heart disease of native coronary artery without angina pectoris: Secondary | ICD-10-CM

## 2011-05-29 DIAGNOSIS — E785 Hyperlipidemia, unspecified: Secondary | ICD-10-CM | POA: Insufficient documentation

## 2011-05-29 MED ORDER — METOPROLOL TARTRATE 25 MG PO TABS: 25.0000 mg | ORAL_TABLET | Freq: Two times a day (BID) | ORAL | Status: DC

## 2011-05-29 MED ORDER — FUROSEMIDE 20 MG PO TABS: 20.0000 mg | ORAL_TABLET | Freq: Every day | ORAL | Status: DC | PRN

## 2011-05-29 MED ORDER — LISINOPRIL 5 MG PO TABS: 5.0000 mg | ORAL_TABLET | Freq: Every day | ORAL | Status: DC

## 2011-05-29 MED ORDER — POTASSIUM CHLORIDE ER 10 MEQ PO TBCR: 10.0000 meq | EXTENDED_RELEASE_TABLET | Freq: Two times a day (BID) | ORAL | Status: DC | PRN

## 2011-05-29 NOTE — Assessment & Plan Note (Signed)
Colonoscopy negative in 02/2011; no significant blood loss based upon normal CBC.

## 2011-05-29 NOTE — Assessment & Plan Note (Signed)
Resolved with adjustment of medication.  Occasional use of low-dose furosemide.

## 2011-05-29 NOTE — Assessment & Plan Note (Signed)
The patient is doing extremely well symptomatically with optimal management of cardiovascular risk factors.

## 2011-05-29 NOTE — Patient Instructions (Signed)
Your physician recommends that you schedule a follow-up appointment in: 1 year  

## 2011-05-29 NOTE — Assessment & Plan Note (Signed)
Rarely symptomatic; occasional as needed use of MDIs

## 2011-05-29 NOTE — Assessment & Plan Note (Signed)
Blood pressure control has been excellent.  Mild bradycardia persists, but with good control of angina, low-dose beta blocker will be continued.

## 2011-05-29 NOTE — Progress Notes (Signed)
Patient ID: Micheal Hawkins, male   DOB: 1932-08-05, 78 y.o.   MRN: [id number] HPI: Scheduled return visit for this very nice gentleman with coronary artery disease and multiple cardiovascular risk factors including hypertension and hyperlipidemia.  He has no known coronary nor vascular disease, good exercise tolerance and an excellent quality of life.  He continues to play some golf.  Prior to Admission medications   Medication Sig Start Date End Date Taking? Authorizing Provider  ADVAIR DISKUS 250-50 MCG/DOSE AEPB Inhale 1 puff into the lungs 2 (two) times daily as needed. Only takes when needed for shortness of breath. 11/29/10  Yes Historical Provider, MD  albuterol (PROVENTIL HFA;VENTOLIN HFA) 108 (90 BASE) MCG/ACT inhaler Inhale 2 puffs into the lungs every 6 (six) hours as needed. For shortness of breath    Yes Historical Provider, MD  ALPRAZolam Prudy Feeler) 0.5 MG tablet Take 0.5 mg by mouth at bedtime as needed. For insomnia    Yes Historical Provider, MD  aspirin EC 81 MG tablet Take 81 mg by mouth daily.     Yes Historical Provider, MD  atorvastatin (LIPITOR) 40 MG tablet Take 40 mg by mouth daily.  12/20/10  Yes Historical Provider, MD  furosemide (LASIX) 20 MG tablet Take 1 tablet (20 mg total) by mouth daily as needed. For excess fluid 05/29/11  Yes Kathlen Brunswick, MD  lisinopril (PRINIVIL,ZESTRIL) 5 MG tablet Take 1 tablet (5 mg total) by mouth daily. 05/29/11  Yes Kathlen Brunswick, MD  metoprolol tartrate (LOPRESSOR) 25 MG tablet Take 1 tablet (25 mg total) by mouth 2 (two) times daily. 05/29/11  Yes Kathlen Brunswick, MD  Multiple Vitamins-Minerals (MULTIVITAMINS THER. W/MINERALS) TABS Take 1 tablet by mouth daily.     Yes Historical Provider, MD  potassium chloride (K-DUR) 10 MEQ tablet Take 1 tablet (10 mEq total) by mouth 2 (two) times daily as needed. When taking fluid pill - lasix. 05/29/11  Yes Kathlen Brunswick, MD   No Known Allergies    Past medical history, social history,  and family history reviewed and updated.  ROS: Denies orthopnea, PND, chronic cough, sputum production, palpitations, dizziness or syncope.  He experiences no chest discomfort.  Exertion does not result in any symptoms.  PHYSICAL EXAM: BP 129/70  Pulse 54  Resp 16  Ht 5\' 7"  (1.702 m)  Wt 63.504 kg (140 lb)  BMI 21.93 kg/m2; weight decreased 5 pounds since last year  General-Well developed; no acute distress HEENT-Pupils equal, round, react to light and accommodation, mildly myotic; bilateral arcus Body habitus-proportionate weight and height Neck-No JVD; no carotid bruits Lungs-clear lung fields; resonant to percussion Cardiovascular-normal PMI; normal S1 and S2 Abdomen-normal bowel sounds; soft and non-tender without masses or organomegaly Musculoskeletal-No deformities, no cyanosis or clubbing Neurologic-Normal cranial nerves; symmetric strength and tone Skin-Warm, no significant lesions Extremities-1-2+ distal pulses; no edema  ASSESSMENT AND PLAN:  Micheal Bing, MD 05/29/2011 2:29 PM

## 2012-05-18 ENCOUNTER — Ambulatory Visit (INDEPENDENT_AMBULATORY_CARE_PROVIDER_SITE_OTHER): Payer: Medicare Other | Admitting: Urology

## 2012-05-18 ENCOUNTER — Other Ambulatory Visit: Payer: Self-pay | Admitting: Cardiology

## 2012-05-18 DIAGNOSIS — R82998 Other abnormal findings in urine: Secondary | ICD-10-CM

## 2012-05-25 ENCOUNTER — Other Ambulatory Visit: Payer: Self-pay | Admitting: Urology

## 2012-06-08 ENCOUNTER — Ambulatory Visit (INDEPENDENT_AMBULATORY_CARE_PROVIDER_SITE_OTHER): Payer: Medicare Other | Admitting: Cardiology

## 2012-06-08 ENCOUNTER — Encounter: Payer: Self-pay | Admitting: Cardiology

## 2012-06-08 VITALS — BP 112/69 | HR 60 | Ht 67.0 in | Wt 125.8 lb

## 2012-06-08 DIAGNOSIS — F17200 Nicotine dependence, unspecified, uncomplicated: Secondary | ICD-10-CM

## 2012-06-08 DIAGNOSIS — C61 Malignant neoplasm of prostate: Secondary | ICD-10-CM

## 2012-06-08 DIAGNOSIS — E785 Hyperlipidemia, unspecified: Secondary | ICD-10-CM

## 2012-06-08 DIAGNOSIS — I7090 Unspecified atherosclerosis: Secondary | ICD-10-CM

## 2012-06-08 DIAGNOSIS — I10 Essential (primary) hypertension: Secondary | ICD-10-CM

## 2012-06-08 DIAGNOSIS — I2510 Atherosclerotic heart disease of native coronary artery without angina pectoris: Secondary | ICD-10-CM

## 2012-06-08 DIAGNOSIS — J449 Chronic obstructive pulmonary disease, unspecified: Secondary | ICD-10-CM

## 2012-06-08 DIAGNOSIS — J4489 Other specified chronic obstructive pulmonary disease: Secondary | ICD-10-CM

## 2012-06-08 DIAGNOSIS — E875 Hyperkalemia: Secondary | ICD-10-CM

## 2012-06-08 DIAGNOSIS — R195 Other fecal abnormalities: Secondary | ICD-10-CM

## 2012-06-08 DIAGNOSIS — Z720 Tobacco use: Secondary | ICD-10-CM

## 2012-06-08 NOTE — Progress Notes (Signed)
Patient ID: Micheal Hawkins, male   DOB: 30-Jun-1932, 79 y.o.   MRN: [id number]  HPI: Schedule return visit for this gregarious gentleman with a history of coronary artery disease and multiple cardiovascular risk factors. Heart disease continues to be quiescent, and exercise tolerance has been good.  He is lately suffered weight loss, anorexia and constipation and is under evaluation by Dr. Lattie Haw for possible recurrent carcinoma of the prostate. Ultrasound imaging and biopsy are planned. He underwent colonoscopy in 2012, apparently with negative results.  Current Outpatient Prescriptions  Medication Sig Dispense Refill  . ADVAIR DISKUS 250-50 MCG/DOSE AEPB Inhale 1 puff into the lungs 2 (two) times daily as needed. Only takes when needed for shortness of breath.      Marland Kitchen albuterol (PROVENTIL HFA;VENTOLIN HFA) 108 (90 BASE) MCG/ACT inhaler Inhale 2 puffs into the lungs every 6 (six) hours as needed. For shortness of breath       . ALPRAZolam (XANAX) 0.5 MG tablet Take 0.5 mg by mouth at bedtime as needed. For insomnia       . aspirin EC 81 MG tablet Take 81 mg by mouth daily.        Marland Kitchen atorvastatin (LIPITOR) 40 MG tablet Take 40 mg by mouth daily.       . ciprofloxacin (CIPRO) 250 MG tablet Take 250 mg by mouth 2 (two) times daily.       . furosemide (LASIX) 20 MG tablet Take 1 tablet (20 mg total) by mouth daily as needed. For excess fluid  30 tablet  12  . lisinopril (PRINIVIL,ZESTRIL) 5 MG tablet TAKE ONE TABLET BY MOUTH EVERY DAY  90 tablet  0  . metoprolol tartrate (LOPRESSOR) 25 MG tablet Take 1 tablet (25 mg total) by mouth 2 (two) times daily.  180 tablet  3  . Multiple Vitamins-Minerals (MULTIVITAMINS THER. W/MINERALS) TABS Take 1 tablet by mouth daily.        . potassium chloride (K-DUR) 10 MEQ tablet Take 1 tablet (10 mEq total) by mouth 2 (two) times daily as needed. When taking fluid pill - lasix.  30 tablet  12   No current facility-administered medications for this visit.   No  Known Allergies   Past medical history, social history, and family history reviewed and updated.  ROS: Denies dyspnea, chest pain, palpitations, lightheadedness or syncope. All other systems reviewed and are negative except as noted above.  PHYSICAL EXAM: BP 112/69  Pulse 60  Ht 5\' 7"  (1.702 m)  Wt 57.063 kg (125 lb 12.8 oz)  BMI 19.7 kg/m2  SpO2 92%;  Body mass index is 19.7 kg/(m^2). General-Well developed; no acute distress Body habitus-thin Neck-No JVD; no carotid bruits Lungs-clear lung fields; resonant to percussion Cardiovascular-normal PMI; normal S1 and S2; grade 1-2/6 basilar systolic ejection murmur Abdomen-normal bowel sounds; soft and non-tender without masses or organomegaly Musculoskeletal-No deformities, no cyanosis or clubbing Neurologic-Normal cranial nerves; symmetric strength and tone Skin-Warm, no significant lesions Extremities-distal pulses intact; no edema  [place] Bing, MD 06/08/2012  12:03 PM  ASSESSMENT AND PLAN

## 2012-06-08 NOTE — Progress Notes (Deleted)
Name: Micheal Hawkins    DOB: 1932/08/16  Age: 79 y.o.  MR#: [id number]       PCP:  Kirk Ruths, MD      Insurance: Payor: [employer] [place] [employer]  Plan: BLUE MEDICARE  Product Type: *No Product type*    CC:    Chief Complaint  Patient presents with  . Coronary Artery Disease    VS Filed Vitals:   06/08/12 1112  BP: 112/69  Pulse: 60  Height: 5\' 7"  (1.702 m)  Weight: 125 lb 12.8 oz (57.063 kg)  SpO2: 92%    Weights Current Weight  06/08/12 125 lb 12.8 oz (57.063 kg)  05/29/11 140 lb (63.504 kg)  02/26/11 140 lb (63.504 kg)    Blood Pressure  BP Readings from Last 3 Encounters:  06/08/12 112/69  05/29/11 129/70  02/26/11 148/63     Admit date:  (Not on file) Last encounter with RMR:  05/18/2012   Allergy Review of patient's allergies indicates no known allergies.  Current Outpatient Prescriptions  Medication Sig Dispense Refill  . ADVAIR DISKUS 250-50 MCG/DOSE AEPB Inhale 1 puff into the lungs 2 (two) times daily as needed. Only takes when needed for shortness of breath.      Marland Kitchen albuterol (PROVENTIL HFA;VENTOLIN HFA) 108 (90 BASE) MCG/ACT inhaler Inhale 2 puffs into the lungs every 6 (six) hours as needed. For shortness of breath       . ALPRAZolam (XANAX) 0.5 MG tablet Take 0.5 mg by mouth at bedtime as needed. For insomnia       . aspirin EC 81 MG tablet Take 81 mg by mouth daily.        Marland Kitchen atorvastatin (LIPITOR) 40 MG tablet Take 40 mg by mouth daily.       . ciprofloxacin (CIPRO) 250 MG tablet Take 250 mg by mouth 2 (two) times daily.       . furosemide (LASIX) 20 MG tablet Take 1 tablet (20 mg total) by mouth daily as needed. For excess fluid  30 tablet  12  . lisinopril (PRINIVIL,ZESTRIL) 5 MG tablet TAKE ONE TABLET BY MOUTH EVERY DAY  90 tablet  0  . metoprolol tartrate (LOPRESSOR) 25 MG tablet Take 1 tablet (25 mg total) by mouth 2 (two) times daily.  180 tablet  3  . Multiple Vitamins-Minerals (MULTIVITAMINS THER. W/MINERALS) TABS Take 1  tablet by mouth daily.        . potassium chloride (K-DUR) 10 MEQ tablet Take 1 tablet (10 mEq total) by mouth 2 (two) times daily as needed. When taking fluid pill - lasix.  30 tablet  12   No current facility-administered medications for this visit.    Discontinued Meds:   There are no discontinued medications.  Patient Active Problem List  Diagnosis  . Arteriosclerotic cardiovascular disease (ASCVD)  . CHRONIC OBSTRUCTIVE PULMONARY DISEASE  . HYPERKALEMIA  . Heme positive stool  . Hyperlipidemia  . Tobacco abuse  . Prostate carcinoma  . Hypertension    LABS    Component Value Date/Time   NA 139 01/28/2011 1141   NA 139 08/09/2010 0953   NA 134* 07/02/2010 1312   NA 139 06/27/2010 0930   K 4.4 01/28/2011 1141   K 4.8 08/09/2010 0953   K 4.5 07/02/2010 1312   K 5.7* 06/27/2010 0930   CL 103 01/28/2011 1141   CL 104 08/09/2010 0953   CL 102 07/02/2010 1312   CL 104 06/27/2010 0930   CO2  24 01/28/2011 1141   CO2 28 08/09/2010 0953   CO2 27 07/02/2010 1312   CO2 24 06/27/2010 0930   GLUCOSE 97 08/09/2010 0953   GLUCOSE 101* 07/02/2010 1312   GLUCOSE 103* 06/27/2010 0930   BUN 11 01/28/2011 1141   BUN 10 08/09/2010 0953   BUN 8 07/02/2010 1312   BUN 10 06/27/2010 0930   CREATININE 0.71 01/28/2011 1141   CREATININE 0.73 08/09/2010 0953   CREATININE 0.69 07/02/2010 1312   CREATININE 0.75 06/27/2010 0930   CREATININE 0.80 01/16/2010   CREATININE 0.71 01/12/2009   CALCIUM 9.2 01/28/2011 1141   CALCIUM 9.5 08/09/2010 0953   CALCIUM 9.0 07/02/2010 1312   CALCIUM 10.2 06/27/2010 0930   GFRNONAA >60 07/02/2010 1312   GFRNONAA >60 01/16/2010   GFRAA  Value: >60        The eGFR has been calculated using the MDRD equation. This calculation has not been validated in all clinical situations. eGFR's persistently <60 mL/min signify possible Chronic Kidney Disease. 07/02/2010 1312   CMP     Component Value Date/Time   NA 139 01/28/2011 1141   NA 139 08/09/2010 0953   K 4.4 01/28/2011 1141   K 4.8 08/09/2010  0953   CL 103 01/28/2011 1141   CL 104 08/09/2010 0953   CO2 24 01/28/2011 1141   CO2 28 08/09/2010 0953   GLUCOSE 97 08/09/2010 0953   BUN 11 01/28/2011 1141   BUN 10 08/09/2010 0953   CREATININE 0.71 01/28/2011 1141   CREATININE 0.69 07/02/2010 1312   CALCIUM 9.2 01/28/2011 1141   CALCIUM 9.5 08/09/2010 0953   PROT 7.0 01/16/2010   ALBUMIN 4.4 01/16/2010   AST 25 01/28/2011 1141   AST 22 01/16/2010   ALT 20 01/28/2011 1141   ALKPHOS 85 01/28/2011 1141   ALKPHOS 82 01/16/2010   BILITOT 0.6 01/28/2011 1141   BILITOT 0.8 11/26/2007   GFRNONAA >60 07/02/2010 1312   GFRAA  Value: >60        The eGFR has been calculated using the MDRD equation. This calculation has not been validated in all clinical situations. eGFR's persistently <60 mL/min signify possible Chronic Kidney Disease. 07/02/2010 1312       Component Value Date/Time   WBC 6.4 02/26/2011 1330   WBC 7.1 01/28/2011 1140   WBC 8.6 01/16/2010   HGB 15.0 02/26/2011 1330   HGB 14.6 01/28/2011 1140   HGB 13.4 07/02/2010 1312   HCT 43.7 02/26/2011 1330   HCT 45 01/28/2011 1140   HCT 39.6 07/02/2010 1312   HCT 44.1 01/16/2010   MCV 92.2 02/26/2011 1330   MCV 94.0 01/16/2010   MCV 94.5 01/12/2009    Lipid Panel     Component Value Date/Time   CHOL 130 01/16/2010   TRIG 83 01/16/2010   HDL 44 01/16/2010   LDLCALC 69 01/16/2010    ABG No results found for this basename: phart, pco2, pco2art, po2, po2art, hco3, tco2, acidbasedef, o2sat     Lab Results  Component Value Date   TSH 2.324 01/12/2009   BNP (last 3 results) No results found for this basename: PROBNP,  in the last 8760 hours Cardiac Panel (last 3 results) No results found for this basename: CKTOTAL, CKMB, TROPONINI, RELINDX,  in the last 72 hours  Iron/TIBC/Ferritin No results found for this basename: iron, tibc, ferritin     EKG Orders placed in visit on 07/02/10  . EKG     Prior Assessment and Plan Problem List as of  06/08/2012     ICD-9-CM     Cardiology Problems    Arteriosclerotic cardiovascular disease (ASCVD)   Last Assessment & Plan   05/29/2011 Office Visit Written 05/29/2011  2:34 PM by Kathlen Brunswick, MD     The patient is doing extremely well symptomatically with optimal management of cardiovascular risk factors.    Hyperlipidemia   Hypertension   Last Assessment & Plan   05/29/2011 Office Visit Written 05/29/2011  2:37 PM by Kathlen Brunswick, MD     Blood pressure control has been excellent.  Mild bradycardia persists, but with good control of angina, low-dose beta blocker will be continued.      Other   CHRONIC OBSTRUCTIVE PULMONARY DISEASE   Last Assessment & Plan   05/29/2011 Office Visit Written 05/29/2011  2:35 PM by Kathlen Brunswick, MD     Rarely symptomatic; occasional as needed use of MDIs    HYPERKALEMIA   Last Assessment & Plan   05/29/2011 Office Visit Written 05/29/2011  2:36 PM by Kathlen Brunswick, MD     Resolved with adjustment of medication.  Occasional use of low-dose furosemide.    Heme positive stool   Last Assessment & Plan   05/29/2011 Office Visit Written 05/29/2011  2:35 PM by Kathlen Brunswick, MD     Colonoscopy negative in 02/2011; no significant blood loss based upon normal CBC.    Tobacco abuse   Prostate carcinoma       Imaging: No results found.

## 2012-06-08 NOTE — Patient Instructions (Addendum)
Your physician recommends that you schedule a follow-up appointment in: 1 year  

## 2012-06-09 ENCOUNTER — Encounter: Payer: Self-pay | Admitting: Cardiology

## 2012-06-09 NOTE — Assessment & Plan Note (Signed)
Blood pressure control has been quite good at recent office visits, perhaps reflecting weight loss. Current medication is efficacious and will be continued.

## 2012-06-09 NOTE — Assessment & Plan Note (Signed)
Absence of lung-related symptoms suggest mild disease.

## 2012-06-09 NOTE — Assessment & Plan Note (Addendum)
Evaluated in the past without a specific etiology identified.  We will monitor Hemoccult testing and CBCs.

## 2012-06-09 NOTE — Assessment & Plan Note (Signed)
Excellent control when last assessed in 2012. We will seek results of more recent testing.

## 2012-06-09 NOTE — Assessment & Plan Note (Addendum)
Re-assessment in progress. 

## 2012-06-09 NOTE — Assessment & Plan Note (Signed)
Normal on the 3 most recent determinations in 2012.  We will continue to follow.

## 2012-06-09 NOTE — Assessment & Plan Note (Signed)
No symptoms at present to suggest myocardial ischemia or congestive heart failure. Current therapy will be maintained.

## 2012-06-15 ENCOUNTER — Ambulatory Visit (HOSPITAL_COMMUNITY)
Admission: RE | Admit: 2012-06-15 | Discharge: 2012-06-15 | Disposition: A | Discharge status: Home / Self Care | Payer: Medicare Other | Source: Ambulatory Visit | Attending: Urology | Admitting: Urology

## 2012-06-15 ENCOUNTER — Other Ambulatory Visit: Payer: Self-pay | Admitting: Urology

## 2012-06-15 DIAGNOSIS — C61 Malignant neoplasm of prostate: Secondary | ICD-10-CM

## 2012-06-15 MED ORDER — LIDOCAINE HCL (PF) 2 % IJ SOLN
INTRAMUSCULAR | Status: AC
  Filled 2012-06-15: qty 10

## 2012-06-15 NOTE — Progress Notes (Signed)
Pt for prostate biopsy. Pt given 2% lidocaine given. No signs of distress.

## 2012-06-22 ENCOUNTER — Other Ambulatory Visit: Payer: Self-pay | Admitting: Urology

## 2012-06-22 DIAGNOSIS — C61 Malignant neoplasm of prostate: Secondary | ICD-10-CM

## 2012-06-24 ENCOUNTER — Other Ambulatory Visit: Payer: Self-pay | Admitting: Cardiology

## 2012-07-06 ENCOUNTER — Encounter (HOSPITAL_COMMUNITY)
Admission: RE | Admit: 2012-07-06 | Discharge: 2012-07-06 | Disposition: A | Discharge status: Home / Self Care | Payer: Medicare Other | Source: Ambulatory Visit | Attending: Urology | Admitting: Urology

## 2012-07-06 DIAGNOSIS — C61 Malignant neoplasm of prostate: Secondary | ICD-10-CM | POA: Insufficient documentation

## 2012-07-06 MED ORDER — TECHNETIUM TC 99M MEDRONATE IV KIT
25.0000 | PACK | Freq: Once | INTRAVENOUS | Status: AC | PRN
  Administered 2012-07-06: 25 via INTRAVENOUS

## 2012-07-19 ENCOUNTER — Encounter: Payer: Self-pay | Admitting: Internal Medicine

## 2012-07-20 ENCOUNTER — Ambulatory Visit (INDEPENDENT_AMBULATORY_CARE_PROVIDER_SITE_OTHER): Payer: Medicare Other | Admitting: Urology

## 2012-07-20 ENCOUNTER — Encounter: Payer: Self-pay | Admitting: Gastroenterology

## 2012-07-20 ENCOUNTER — Ambulatory Visit (INDEPENDENT_AMBULATORY_CARE_PROVIDER_SITE_OTHER): Payer: Medicare Other | Admitting: Gastroenterology

## 2012-07-20 ENCOUNTER — Other Ambulatory Visit: Payer: Self-pay | Admitting: Gastroenterology

## 2012-07-20 VITALS — BP 99/61 | HR 67 | Temp 98.3°F | Ht 66.0 in | Wt 121.4 lb

## 2012-07-20 DIAGNOSIS — R932 Abnormal findings on diagnostic imaging of liver and biliary tract: Secondary | ICD-10-CM

## 2012-07-20 DIAGNOSIS — R634 Abnormal weight loss: Secondary | ICD-10-CM | POA: Insufficient documentation

## 2012-07-20 DIAGNOSIS — C61 Malignant neoplasm of prostate: Secondary | ICD-10-CM

## 2012-07-20 NOTE — Progress Notes (Signed)
CC PCP 

## 2012-07-20 NOTE — Assessment & Plan Note (Signed)
79 year old gentleman with 20 pound unintentional weight loss, abnormal biliary tree, pancreatic duct on CT. There is question of some vague density along the common bile duct wall, cannot exclude stones or inflammation. Gallbladder polyp versus stone. There is also dilation of the dorsal pancreatic duct until it reaches the pancreatic body. There is abnormal stranding surround the celiac trunk and SMA. LFTs are unknown.  I have discussed this case with Dr. Jena Gauss. Patient is to have blood work today. Dr. Jena Gauss to review CT scan with radiologist. Based on LFTs, he will likely need MRCP as next step. Further recommendations to follow.

## 2012-07-20 NOTE — Patient Instructions (Signed)
Please have your blood work done today. I will review your CT films with the radiologist and Dr. Jena Gauss. We will call you with the next step.

## 2012-07-20 NOTE — Progress Notes (Signed)
Primary Care Physician:  MCGOUGH,WILLIAM M, MD  Primary Gastroenterologist:  Michael Rourk, MD   Chief Complaint  Patient presents with  . Results    HPI:  Micheal Hawkins is a 79 y.o. male here for followup of abnormal CT scan. Recently had CT scan ordered by Dr. Stephen Dahlstedl. Patient has had climbing PSA with history of prior prostate cancer in 2003 treated with radiotherapy. Recent prostate biopsy showed adenocarcinoma. Patient reports four months of unintentional weight loss. He has lost 20 pounds. Appetite ok. No n/v. Occasional vague abd pain. No heartburn. BM ok. Used laxative one month ago. BP dropped with weight loss. No melena, brbpr. PSA 2 to 8. No rash, fever, joint pain. Denies melena, rectal bleeding, heartburn, dysphagia. Denies postprandial abdominal pain.  Current Outpatient Prescriptions  Medication Sig Dispense Refill  . ADVAIR DISKUS 250-50 MCG/DOSE AEPB Inhale 1 puff into the lungs 2 (two) times daily as needed. Only takes when needed for shortness of breath.      . albuterol (PROVENTIL HFA;VENTOLIN HFA) 108 (90 BASE) MCG/ACT inhaler Inhale 2 puffs into the lungs every 6 (six) hours as needed. For shortness of breath       . ALPRAZolam (XANAX) 0.5 MG tablet Take 0.5 mg by mouth at bedtime as needed. For insomnia       . aspirin EC 81 MG tablet Take 81 mg by mouth daily.        . atorvastatin (LIPITOR) 40 MG tablet Take 40 mg by mouth daily.       . ciprofloxacin (CIPRO) 250 MG tablet Take 250 mg by mouth 2 (two) times daily.       . furosemide (LASIX) 20 MG tablet Take 1 tablet (20 mg total) by mouth daily as needed. For excess fluid  30 tablet  12  . lisinopril (PRINIVIL,ZESTRIL) 5 MG tablet TAKE ONE TABLET BY MOUTH EVERY DAY  90 tablet  0  . metoprolol tartrate (LOPRESSOR) 25 MG tablet TAKE ONE TABLET BY MOUTH TWICE DAILY  62 tablet  0  . Multiple Vitamins-Minerals (MULTIVITAMINS THER. W/MINERALS) TABS Take 1 tablet by mouth daily.        . potassium chloride  (K-DUR) 10 MEQ tablet Take 1 tablet (10 mEq total) by mouth 2 (two) times daily as needed. When taking fluid pill - lasix.  30 tablet  12   No current facility-administered medications for this visit.    Allergies as of 07/20/2012  . (No Known Allergies)    Past Medical History  Diagnosis Date  . Prostate carcinoma 2003    RT  . COPD (chronic obstructive pulmonary disease)   . Arteriosclerotic cardiovascular disease (ASCVD)     H/O CHF in 1995; CABG 1988;NSTEMI in 02/2009-recurrent 3-VD, unfavorable for PCI-TO of the SVG to the OM1&2, TO SVG to the PDA and PL branch of the RCA, patent LIMA to LAD and SVG to D1 and D2; continuing exertional angina with EF40%  . Hypertension   . Hyperlipidemia   . Tobacco abuse, in remission     40-pack-year consumption  . Peptic ulcer disease 1970    Gastric ulcer; required surgical intervention    Past Surgical History  Procedure Laterality Date  . Coronary artery bypass graft  1988  . Repair of perforated ulcer  1970    PUD  . Colonoscopy  09/27/2002    RMR:Normal rectum and colon  . Colonoscopy  02/26/2011    RMR: Single anal papilla/Sigmoid diverticula/otherwise normal rectum. TI normal.       Family History  Problem Relation Age of Onset  . Coronary artery disease Mother   . Stroke Mother   . Bone cancer Father   . COPD Sister   . Coronary artery disease Sister   . Liver disease Neg Hx   . Pancreatic cancer Neg Hx   . Colon cancer Neg Hx     History   Social History  . Marital Status: Married    Spouse Name: N/A    Number of Children: 0  . Years of Education: N/A   Occupational History  . Retired     [employer]  . 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 Smoking status: Former Smoker -- 1.00 packs/day for 40 years    Types: Cigarettes  . Smokeless tobacco: Former User    Quit date: 04/05/1993  . Alcohol Use: No  . Drug Use: No  . Sexually Active: Not on  file   Other Topics Concern  . Not on file   Social History Narrative  . No narrative on file      ROS:  General: Negative for anorexia, weight loss, fever, chills, fatigue, weakness. Eyes: Negative for vision changes.  ENT: Negative for hoarseness, difficulty swallowing , nasal congestion. CV: Negative for chest pain, angina, palpitations, dyspnea on exertion, peripheral edema.  Respiratory: Negative for dyspnea at rest, dyspnea on exertion, cough, sputum, wheezing.  GI: See history of present illness. GU:  Negative for dysuria, hematuria, urinary incontinence, urinary frequency, nocturnal urination.  MS: Negative for joint pain, low back pain.  Derm: Negative for rash or itching.  Neuro: Negative for weakness, abnormal sensation, seizure, frequent headaches, memory loss, confusion.  Psych: Negative for anxiety, depression, suicidal ideation, hallucinations.  Endo: Negative for unusual weight change.  Heme: Negative for bruising or bleeding. Allergy: Negative for rash or hives.    Physical Examination:  BP 99/61  Pulse 67  Temp(Src) 98.3 F (36.8 C) (Oral)  Ht 5' 6" (1.676 m)  Wt 121 lb 6.4 oz (55.067 kg)  BMI 19.6 kg/m2   General: Well-nourished, well-developed in no acute distress. Accompanied by wife. Head: Normocephalic, atraumatic.   Eyes: Conjunctiva pink, no icterus. Mouth: Oropharyngeal mucosa moist and pink , no lesions erythema or exudate. Neck: Supple without thyromegaly, masses, or lymphadenopathy.  Lungs: Clear to auscultation bilaterally.  Heart: Regular rate and rhythm, no murmurs rubs or gallops.  Abdomen: Bowel sounds are normal, nontender, nondistended, no hepatosplenomegaly or masses, no abdominal bruits or    hernia , no rebound or guarding.   Rectal: Not performed. Extremities: No lower extremity edema. No clubbing or deformities.  Neuro: Alert and oriented x 4 , grossly normal neurologically.  Skin: Warm and dry, no rash or jaundice.   Psych:  Alert and cooperative, normal mood and affect.  Labs: Labs from November 2013 BUN 9, creatinine 0.72, total bilirubin 0.8, alkaline phosphatase 96, AST 21, ALT 19, albumin 4.4, hemoglobin 15.4, platelets 273,000  Imaging Studies: Nm Bone Scan Whole Body  07/06/2012  *RADIOLOGY REPORT*  Clinical Data: Prostate cancer.  PSA of 8.1.  No bone pain. History of trauma years ago.  NUCLEAR MEDICINE WHOLE BODY BONE SCINTIGRAPHY  Technique:  Whole body anterior and posterior images were obtained approximately 3 hours after intravenous injection of radiopharmaceutical.  Radiopharmaceutical: 25MILLI CURIE TC-MDP TECHNETIUM TC 99M MEDRONATE IV KIT  Comparison: 06/23/2003 and abdominal pelvic CT of same date.  Findings: Scattered areas of mild degenerative uptake,   including about the bilateral knees and right ankle.  No suspicious areas to suggest osseous metastasis.  Physiologic uptake in the kidneys and urinary bladder.  IMPRESSION: No evidence of osseous metastasis.   Original Report Authenticated By: Kyle Talbot, M.D.    CT of the abdomen and pelvis with contrast, 07/06/2012 Intrahepatic biliary dilatation with vague density along the common bile duct wall potentially from stones or inflammation. Mildly dilated distal dorsal pancreatic duct with truncation of the duct in the pancreatic body. There is also abnormal stranding surrounding the celiac trunk and SMA which is nonspecific. Truncation of the pancreatic duct warrants further workup to exclude an occult tumor. Porta hepatis node with short axis diameter of 1 cm. Portal caval node with short axis diameter of 1.5 cm. Mild pancreatic atrophy. 5 mm density in the gallbladder possibly a gallstone or polyp.  Report viewable via PACS.     

## 2012-07-21 ENCOUNTER — Telehealth: Payer: Self-pay | Admitting: Gastroenterology

## 2012-07-21 LAB — CBC WITH DIFFERENTIAL/PLATELET
Basophils Absolute: 0.1 10*3/uL (ref 0.0–0.1)
Eosinophils Relative: 6 % — ABNORMAL HIGH (ref 0–5)
Lymphocytes Relative: 18 % (ref 12–46)
Lymphs Abs: 1.3 10*3/uL (ref 0.7–4.0)
MCV: 87.7 fL (ref 78.0–100.0)
Neutro Abs: 5.2 10*3/uL (ref 1.7–7.7)
Neutrophils Relative %: 68 % (ref 43–77)
Platelets: 295 10*3/uL (ref 150–400)
RBC: 4.46 MIL/uL (ref 4.22–5.81)
RDW: 15.4 % (ref 11.5–15.5)
WBC: 7.5 10*3/uL (ref 4.0–10.5)

## 2012-07-21 LAB — HEPATIC FUNCTION PANEL
Alkaline Phosphatase: 85 U/L (ref 39–117)
Bilirubin, Direct: 0.2 mg/dL (ref 0.0–0.3)
Indirect Bilirubin: 0.5 mg/dL (ref 0.0–0.9)
Total Bilirubin: 0.7 mg/dL (ref 0.3–1.2)
Total Protein: 6.4 g/dL (ref 6.0–8.3)

## 2012-07-21 LAB — BASIC METABOLIC PANEL
BUN: 12 mg/dL (ref 6–23)
CO2: 24 mEq/L (ref 19–32)
Chloride: 99 mEq/L (ref 96–112)
Creat: 0.75 mg/dL (ref 0.50–1.35)
Glucose, Bld: 141 mg/dL — ABNORMAL HIGH (ref 70–99)
Potassium: 4.5 mEq/L (ref 3.5–5.3)

## 2012-07-21 LAB — LIPASE: Lipase: 39 U/L (ref 0–75)

## 2012-07-21 NOTE — Progress Notes (Signed)
New lab added on to existing blood work at [facility].

## 2012-07-21 NOTE — Progress Notes (Addendum)
Please schedule EUS ASAP with Dr. Christella Hartigan. Abnormal pancreas on CT. Dr. Jena Gauss reviewed CT with Dr. Bradly Chris and biliary tree unimpressive, more concerned about there being underlying pancreatic tumor (patient is not aware).   Please add on CA-19-9.  LMOAM for return call from patient. I need to discuss with him.

## 2012-07-21 NOTE — Addendum Note (Signed)
Addended by: Tiffany Kocher on: 07/21/2012 04:02 PM   Modules accepted: Orders

## 2012-07-21 NOTE — Progress Notes (Signed)
Referral has been sent to Patty Lewis and Dr. Jacobs 

## 2012-07-21 NOTE — Telephone Encounter (Signed)
Referral has been sent to [facility] Abrahams and Dr. Christella Hartigan

## 2012-07-21 NOTE — Telephone Encounter (Addendum)
Spoke to the patient's wife, Wynona Canes regarding lab work and review of CT scan. She is in agreement of schedule an EUS with Dr. Christella Hartigan. Please proceed.

## 2012-07-21 NOTE — Progress Notes (Signed)
Quick Note:  LMOAM for return call. Labs overall ok. LFTs are normal. See addendum to OV noted dated 07/20/12. Patient needs ASAP EUS. ______

## 2012-07-22 ENCOUNTER — Encounter: Payer: Self-pay | Admitting: Gastroenterology

## 2012-07-22 ENCOUNTER — Other Ambulatory Visit: Payer: Self-pay

## 2012-07-22 ENCOUNTER — Encounter (HOSPITAL_COMMUNITY): Payer: Self-pay | Admitting: Pharmacy Technician

## 2012-07-22 ENCOUNTER — Telehealth: Payer: Self-pay

## 2012-07-22 DIAGNOSIS — K8689 Other specified diseases of pancreas: Secondary | ICD-10-CM

## 2012-07-22 LAB — CANCER ANTIGEN 19-9: CA 19-9: 26.8 U/mL (ref <35.0)

## 2012-07-22 NOTE — Telephone Encounter (Signed)
Message copied by Donata Duff on Thu Jul 22, 2012  1:07 PM ------      Message from: Rob Bunting P      Created: Thu Jul 22, 2012 12:43 PM      Regarding: RE: EUS       He needs upper EUS next week. Radial +/- linear, 60 min, dx: dilated bile, pancreas duct. MAC +. He must hand carry the CT on disc ([facility] CT scan is not visible via epic system)            Thanks                        ----- Message -----         From: Donata Duff, CMA         Sent: 07/22/2012   8:07 AM           To: Rachael Fee, MD      Subject: FW: EUS                                                              ----- Message -----         From: Glendora Score         Sent: 07/21/2012   4:12 PM           To: Donata Duff, CMA      Subject: EUS                                                      Please schedule EUS ASAP with Dr. Christella Hartigan. Abnormal pancreas on CT. Dr. Jena Gauss reviewed CT with Dr. Bradly Chris and biliary tree unimpressive, more concerned about there being underlying pancreatic tumor (patient is not aware).                          ------

## 2012-07-22 NOTE — Telephone Encounter (Signed)
Line rings busy  

## 2012-07-22 NOTE — Telephone Encounter (Signed)
Pt has been scheduled for 07/29/12 needs to be instructed and meds reviewed

## 2012-07-23 ENCOUNTER — Encounter (HOSPITAL_COMMUNITY): Payer: Self-pay | Admitting: *Deleted

## 2012-07-23 NOTE — Telephone Encounter (Signed)
Line continues to ring busy instructions have been mailed to the home

## 2012-07-23 NOTE — Telephone Encounter (Signed)
p 

## 2012-07-23 NOTE — Progress Notes (Addendum)
Cbc with dif , hepatic function bmet epic 06-08-2012 lov note dr Dietrich Pates cardiology epic

## 2012-07-25 ENCOUNTER — Other Ambulatory Visit: Payer: Self-pay | Admitting: Cardiology

## 2012-07-27 ENCOUNTER — Ambulatory Visit (INDEPENDENT_AMBULATORY_CARE_PROVIDER_SITE_OTHER): Payer: Medicare Other | Admitting: Urology

## 2012-07-27 DIAGNOSIS — C61 Malignant neoplasm of prostate: Secondary | ICD-10-CM

## 2012-07-27 NOTE — Progress Notes (Signed)
Quick Note:  Lab ok.  EUS as planned. ______

## 2012-07-27 NOTE — Telephone Encounter (Signed)
Pt has been instructed and meds reviewed pt will call with any questions or concerns 

## 2012-07-29 ENCOUNTER — Encounter (HOSPITAL_COMMUNITY): Payer: Self-pay | Admitting: Certified Registered Nurse Anesthetist

## 2012-07-29 ENCOUNTER — Encounter (HOSPITAL_COMMUNITY)
Admission: RE | Disposition: A | Discharge status: Home / Self Care | Payer: Self-pay | Source: Ambulatory Visit | Attending: Gastroenterology

## 2012-07-29 ENCOUNTER — Ambulatory Visit (HOSPITAL_COMMUNITY): Payer: Medicare Other | Admitting: Certified Registered Nurse Anesthetist

## 2012-07-29 ENCOUNTER — Ambulatory Visit (HOSPITAL_COMMUNITY)
Admission: RE | Admit: 2012-07-29 | Discharge: 2012-07-29 | Disposition: A | Discharge status: Home / Self Care | Payer: Medicare Other | Source: Ambulatory Visit | Attending: Gastroenterology | Admitting: Gastroenterology

## 2012-07-29 ENCOUNTER — Encounter (HOSPITAL_COMMUNITY): Payer: Self-pay | Admitting: *Deleted

## 2012-07-29 DIAGNOSIS — Z8546 Personal history of malignant neoplasm of prostate: Secondary | ICD-10-CM | POA: Insufficient documentation

## 2012-07-29 DIAGNOSIS — K2990 Gastroduodenitis, unspecified, without bleeding: Secondary | ICD-10-CM | POA: Insufficient documentation

## 2012-07-29 DIAGNOSIS — R634 Abnormal weight loss: Secondary | ICD-10-CM | POA: Insufficient documentation

## 2012-07-29 DIAGNOSIS — I10 Essential (primary) hypertension: Secondary | ICD-10-CM | POA: Insufficient documentation

## 2012-07-29 DIAGNOSIS — Z951 Presence of aortocoronary bypass graft: Secondary | ICD-10-CM | POA: Insufficient documentation

## 2012-07-29 DIAGNOSIS — J4489 Other specified chronic obstructive pulmonary disease: Secondary | ICD-10-CM | POA: Insufficient documentation

## 2012-07-29 DIAGNOSIS — K2970 Gastritis, unspecified, without bleeding: Secondary | ICD-10-CM | POA: Insufficient documentation

## 2012-07-29 DIAGNOSIS — E785 Hyperlipidemia, unspecified: Secondary | ICD-10-CM | POA: Insufficient documentation

## 2012-07-29 DIAGNOSIS — Z79899 Other long term (current) drug therapy: Secondary | ICD-10-CM | POA: Insufficient documentation

## 2012-07-29 DIAGNOSIS — K8689 Other specified diseases of pancreas: Secondary | ICD-10-CM

## 2012-07-29 DIAGNOSIS — R932 Abnormal findings on diagnostic imaging of liver and biliary tract: Secondary | ICD-10-CM

## 2012-07-29 DIAGNOSIS — J449 Chronic obstructive pulmonary disease, unspecified: Secondary | ICD-10-CM | POA: Insufficient documentation

## 2012-07-29 HISTORY — PX: EUS: SHX5427

## 2012-07-29 SURGERY — UPPER ENDOSCOPIC ULTRASOUND (EUS) LINEAR
Anesthesia: Monitor Anesthesia Care

## 2012-07-29 MED ORDER — KETAMINE HCL 10 MG/ML IJ SOLN
INTRAMUSCULAR | Status: DC | PRN
  Administered 2012-07-29: 20 mg via INTRAVENOUS

## 2012-07-29 MED ORDER — PHENYLEPHRINE HCL 10 MG/ML IJ SOLN
INTRAMUSCULAR | Status: DC | PRN
  Administered 2012-07-29 (×3): 80 ug via INTRAVENOUS

## 2012-07-29 MED ORDER — MIDAZOLAM HCL 5 MG/5ML IJ SOLN
INTRAMUSCULAR | Status: DC | PRN
  Administered 2012-07-29 (×2): 1 mg via INTRAVENOUS

## 2012-07-29 MED ORDER — LIDOCAINE HCL (CARDIAC) 20 MG/ML IV SOLN
INTRAVENOUS | Status: DC | PRN
  Administered 2012-07-29: 80 mg via INTRAVENOUS

## 2012-07-29 MED ORDER — LACTATED RINGERS IV SOLN
INTRAVENOUS | Status: DC
  Administered 2012-07-29: 11:00:00 via INTRAVENOUS

## 2012-07-29 MED ORDER — PROMETHAZINE HCL 25 MG/ML IJ SOLN: 6.2500 mg | INTRAMUSCULAR | Status: DC | PRN

## 2012-07-29 MED ORDER — ONDANSETRON HCL 4 MG/2ML IJ SOLN
INTRAMUSCULAR | Status: DC | PRN
  Administered 2012-07-29: 4 mg via INTRAVENOUS

## 2012-07-29 MED ORDER — BUTAMBEN-TETRACAINE-BENZOCAINE 2-2-14 % EX AERO
INHALATION_SPRAY | CUTANEOUS | Status: DC | PRN
  Administered 2012-07-29: 2 via TOPICAL

## 2012-07-29 MED ORDER — SODIUM CHLORIDE 0.9 % IV SOLN: INTRAVENOUS | Status: DC

## 2012-07-29 MED ORDER — PROPOFOL INFUSION 10 MG/ML OPTIME
INTRAVENOUS | Status: DC | PRN
  Administered 2012-07-29: 120 ug/kg/min via INTRAVENOUS

## 2012-07-29 NOTE — H&P (View-Only) (Signed)
Primary Care Physician:  Kirk Ruths, MD  Primary Gastroenterologist:  Roetta Sessions, MD   Chief Complaint  Patient presents with  . Results    HPI:  Micheal Hawkins is a 79 y.o. male here for followup of abnormal CT scan. Recently had CT scan ordered by Dr. Murriel Hopper. Patient has had climbing PSA with history of prior prostate cancer in 2003 treated with radiotherapy. Recent prostate biopsy showed adenocarcinoma. Patient reports four months of unintentional weight loss. He has lost 20 pounds. Appetite ok. No n/v. Occasional vague abd pain. No heartburn. BM ok. Used laxative one month ago. BP dropped with weight loss. No melena, brbpr. PSA 2 to 8. No rash, fever, joint pain. Denies melena, rectal bleeding, heartburn, dysphagia. Denies postprandial abdominal pain.  Current Outpatient Prescriptions  Medication Sig Dispense Refill  . ADVAIR DISKUS 250-50 MCG/DOSE AEPB Inhale 1 puff into the lungs 2 (two) times daily as needed. Only takes when needed for shortness of breath.      Marland Kitchen albuterol (PROVENTIL HFA;VENTOLIN HFA) 108 (90 BASE) MCG/ACT inhaler Inhale 2 puffs into the lungs every 6 (six) hours as needed. For shortness of breath       . ALPRAZolam (XANAX) 0.5 MG tablet Take 0.5 mg by mouth at bedtime as needed. For insomnia       . aspirin EC 81 MG tablet Take 81 mg by mouth daily.        Marland Kitchen atorvastatin (LIPITOR) 40 MG tablet Take 40 mg by mouth daily.       . ciprofloxacin (CIPRO) 250 MG tablet Take 250 mg by mouth 2 (two) times daily.       . furosemide (LASIX) 20 MG tablet Take 1 tablet (20 mg total) by mouth daily as needed. For excess fluid  30 tablet  12  . lisinopril (PRINIVIL,ZESTRIL) 5 MG tablet TAKE ONE TABLET BY MOUTH EVERY DAY  90 tablet  0  . metoprolol tartrate (LOPRESSOR) 25 MG tablet TAKE ONE TABLET BY MOUTH TWICE DAILY  62 tablet  0  . Multiple Vitamins-Minerals (MULTIVITAMINS THER. W/MINERALS) TABS Take 1 tablet by mouth daily.        . potassium chloride  (K-DUR) 10 MEQ tablet Take 1 tablet (10 mEq total) by mouth 2 (two) times daily as needed. When taking fluid pill - lasix.  30 tablet  12   No current facility-administered medications for this visit.    Allergies as of 07/20/2012  . (No Known Allergies)    Past Medical History  Diagnosis Date  . Prostate carcinoma 2003    RT  . COPD (chronic obstructive pulmonary disease)   . Arteriosclerotic cardiovascular disease (ASCVD)     H/O CHF in 1995; CABG 1988;NSTEMI in 02/2009-recurrent 3-VD, unfavorable for PCI-TO of the SVG to the OM1&2, TO SVG to the PDA and PL branch of the RCA, patent LIMA to LAD and SVG to D1 and D2; continuing exertional angina with EF40%  . Hypertension   . Hyperlipidemia   . Tobacco abuse, in remission     40-pack-year consumption  . Peptic ulcer disease 1970    Gastric ulcer; required surgical intervention    Past Surgical History  Procedure Laterality Date  . Coronary artery bypass graft  1988  . Repair of perforated ulcer  1970    PUD  . Colonoscopy  09/27/2002    ZOX:WRUEAV rectum and colon  . Colonoscopy  02/26/2011    RMR: Single anal papilla/Sigmoid diverticula/otherwise normal rectum. TI normal.  Family History  Problem Relation Age of Onset  . Coronary artery disease Mother   . Stroke Mother   . Bone cancer Father   . COPD Sister   . Coronary artery disease Sister   . Liver disease Neg Hx   . Pancreatic cancer Neg Hx   . Colon cancer Neg Hx     History   Social History  . Marital Status: Married    Spouse Name: N/A    Number of Children: 0  . Years of Education: N/A   Occupational History  . Retired     [employer]  . 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 Smoking status: Former Smoker -- 1.00 packs/day for 40 years    Types: Cigarettes  . Smokeless tobacco: Former Neurosurgeon    Quit date: 04/05/1993  . Alcohol Use: No  . Drug Use: No  . Sexually Active: Not on  file   Other Topics Concern  . Not on file   Social History Narrative  . No narrative on file      ROS:  General: Negative for anorexia, weight loss, fever, chills, fatigue, weakness. Eyes: Negative for vision changes.  ENT: Negative for hoarseness, difficulty swallowing , nasal congestion. CV: Negative for chest pain, angina, palpitations, dyspnea on exertion, peripheral edema.  Respiratory: Negative for dyspnea at rest, dyspnea on exertion, cough, sputum, wheezing.  GI: See history of present illness. GU:  Negative for dysuria, hematuria, urinary incontinence, urinary frequency, nocturnal urination.  MS: Negative for joint pain, low back pain.  Derm: Negative for rash or itching.  Neuro: Negative for weakness, abnormal sensation, seizure, frequent headaches, memory loss, confusion.  Psych: Negative for anxiety, depression, suicidal ideation, hallucinations.  Endo: Negative for unusual weight change.  Heme: Negative for bruising or bleeding. Allergy: Negative for rash or hives.    Physical Examination:  BP 99/61  Pulse 67  Temp(Src) 98.3 F (36.8 C) (Oral)  Ht 5\' 6"  (1.676 m)  Wt 121 lb 6.4 oz (55.067 kg)  BMI 19.6 kg/m2   General: Well-nourished, well-developed in no acute distress. Accompanied by wife. Head: Normocephalic, atraumatic.   Eyes: Conjunctiva pink, no icterus. Mouth: Oropharyngeal mucosa moist and pink , no lesions erythema or exudate. Neck: Supple without thyromegaly, masses, or lymphadenopathy.  Lungs: Clear to auscultation bilaterally.  Heart: Regular rate and rhythm, no murmurs rubs or gallops.  Abdomen: Bowel sounds are normal, nontender, nondistended, no hepatosplenomegaly or masses, no abdominal bruits or    hernia , no rebound or guarding.   Rectal: Not performed. Extremities: No lower extremity edema. No clubbing or deformities.  Neuro: Alert and oriented x 4 , grossly normal neurologically.  Skin: Warm and dry, no rash or jaundice.   Psych:  Alert and cooperative, normal mood and affect.  Labs: Labs from November 2013 BUN 9, creatinine 0.72, total bilirubin 0.8, alkaline phosphatase 96, AST 21, ALT 19, albumin 4.4, hemoglobin 15.4, platelets 273,000  Imaging Studies: Nm Bone Scan Whole Body  07/06/2012  *RADIOLOGY REPORT*  Clinical Data: Prostate cancer.  PSA of 8.1.  No bone pain. History of trauma years ago.  NUCLEAR MEDICINE WHOLE BODY BONE SCINTIGRAPHY  Technique:  Whole body anterior and posterior images were obtained approximately 3 hours after intravenous injection of radiopharmaceutical.  Radiopharmaceutical: CURIE TC-MDP TECHNETIUM TC 47M MEDRONATE IV KIT  Comparison: 06/23/2003 and abdominal pelvic CT of same date.  Findings: Scattered areas of mild degenerative uptake,  including about the bilateral knees and right ankle.  No suspicious areas to suggest osseous metastasis.  Physiologic uptake in the kidneys and urinary bladder.  IMPRESSION: No evidence of osseous metastasis.   Original Report Authenticated By: Jeronimo Greaves, M.D.    CT of the abdomen and pelvis with contrast, 07/06/2012 Intrahepatic biliary dilatation with vague density along the common bile duct wall potentially from stones or inflammation. Mildly dilated distal dorsal pancreatic duct with truncation of the duct in the pancreatic body. There is also abnormal stranding surrounding the celiac trunk and SMA which is nonspecific. Truncation of the pancreatic duct warrants further workup to exclude an occult tumor. Porta hepatis node with short axis diameter of 1 cm. Portal caval node with short axis diameter of 1.5 cm. Mild pancreatic atrophy. 5 mm density in the gallbladder possibly a gallstone or polyp.  Report viewable via PACS.

## 2012-07-29 NOTE — Transfer of Care (Signed)
Immediate Anesthesia Transfer of Care Note  Patient: Micheal Hawkins  Procedure(s) Performed: Procedure(s) (LRB): UPPER ENDOSCOPIC ULTRASOUND (EUS) LINEAR (N/A)  Patient Location: PACU  Anesthesia Type: MAC  Level of Consciousness: sedated, patient cooperative and responds to stimulaton  Airway & Oxygen Therapy: Patient Spontanous Breathing and Patient connected to face mask oxgen  Post-op Assessment: Report given to PACU RN and Post -op Vital signs reviewed and stable  Post vital signs: Reviewed and stable  Complications: No apparent anesthesia complications

## 2012-07-29 NOTE — Interval H&P Note (Signed)
History and Physical Interval Note:  07/29/2012 11:27 AM  Micheal Hawkins  has presented today for surgery, with the diagnosis of Pancreatic duct dilated [577.8]  The various methods of treatment have been discussed with the patient and family. After consideration of risks, benefits and other options for treatment, the patient has consented to  Procedure(s): UPPER ENDOSCOPIC ULTRASOUND (EUS) LINEAR (N/A) as a surgical intervention .  The patient's history has been reviewed, patient examined, no change in status, stable for surgery.  I have reviewed the patient's chart and labs.  Questions were answered to the patient's satisfaction.     Rob Bunting

## 2012-07-29 NOTE — Anesthesia Preprocedure Evaluation (Addendum)
Anesthesia Evaluation  Patient identified by MRN, date of birth, ID band Patient awake    Reviewed: Allergy & Precautions, H&P , NPO status , Patient's Chart, lab work & pertinent test results  Airway Mallampati: II TM Distance: >3 FB Neck ROM: Full    Dental no notable dental hx.    Pulmonary COPD COPD inhaler, former smoker,  breath sounds clear to auscultation  Pulmonary exam normal       Cardiovascular hypertension, Pt. on medications and Pt. on home beta blockers + CABG and +CHF Rhythm:Irregular Rate:Tachycardia  H/O CHF in 1995; CABG 1988;NSTEMI in 02/2009-recurrent 3-VD, unfavorable for PCI-TO of the SVG to the OM1&2, TO SVG to the PDA and PL branch of the RCA, patent LIMA to LAD and SVG to D1 and D2; continuing exertional angina with EF40%   Neuro/Psych negative neurological ROS  negative psych ROS   GI/Hepatic negative GI ROS, Neg liver ROS,   Endo/Other  negative endocrine ROS  Renal/GU negative Renal ROS  negative genitourinary   Musculoskeletal negative musculoskeletal ROS (+)   Abdominal   Peds negative pediatric ROS (+)  Hematology negative hematology ROS (+)   Anesthesia Other Findings   Reproductive/Obstetrics negative OB ROS                          Anesthesia Physical Anesthesia Plan  ASA: III  Anesthesia Plan: MAC   Post-op Pain Management:    Induction: Intravenous  Airway Management Planned: Nasal Cannula  Additional Equipment:   Intra-op Plan:   Post-operative Plan:   Informed Consent: I have reviewed the patients History and Physical, chart, labs and discussed the procedure including the risks, benefits and alternatives for the proposed anesthesia with the patient or authorized representative who has indicated his/her understanding and acceptance.     Plan Discussed with: CRNA and Surgeon  Anesthesia Plan Comments: (A fib with RVR pulse 120's, no  documentation of this as being a current problem)       Anesthesia Quick Evaluation

## 2012-07-29 NOTE — Op Note (Signed)
[facility] [address] [place], [address]   ENDOSCOPIC ULTRASOUND PROCEDURE REPORT  PATIENT: Micheal Hawkins, Micheal Hawkins  MR#: [id number] BIRTHDATE: 09-11-1932  GENDER: Male ENDOSCOPIST: Rachael Fee, MD REFERRED BY:  Roetta Sessions, M.D. PROCEDURE DATE:  07/29/2012 PROCEDURE:   Upper EUS w/FNA ASA CLASS:      Class III INDICATIONS:   weight loss, remote gastric surgery for ulcer, recent CT showing dilated pancreatic duct and possible abnormal soft tissue in or near CBD; normal LFTs. MEDICATIONS: MAC sedation, administered by CRNA DESCRIPTION OF PROCEDURE:   After the risks benefits and alternatives of the procedure were  explained, informed consent was obtained. The patient was then placed in the left, lateral, decubitus postion and IV sedation was administered. Throughout the procedure, the patients blood pressure, pulse and oxygen saturations were monitored continuously.  Under direct visualization, the Pentax EUS Radial [id number]  endoscope was introduced through the mouth  and advanced to the anastomosis . Water was used as necessary to provide an acoustic interface.  Upon completion of the imaging, water was removed and the patient was sent to the recovery room in satisfactory condition.  Endoscopic findings (limited views with radial and linear echoendoscopes): 1. Normal esophagus 2. Mild gastritis 3. Bilroth II type anatomy EUS findings: 1. Bilroth II anatomy precluded complete examination. 2. CBD, pancreatic head was not visible via EUS 3. Main pancreatic duct was ectactic and slightly dilated in the body (4mm).  I followed this to an area of abnormal pancreas that was NOT clearly a solid mass. This area of pancreas was very indistinct, there were mixed hyper and hypoechoic signals but appeared to be the location of transition from dilated pancreatic duct.   I sampled this region of the pancreas with 2 transgastric passes with a 22 gauge EUS FNA  needle.  Impression: Bilroth II antomy precluded complete examination (could not visualize CBD or head of pancreas region).  The main pancreatic duct was slightly dilated and this appeared to terminate in the body of pancreas in a region of abnormal but NOT clearly mass-like parenchyma. This region was sampled with FNA.   _______________________________ eSigned:  Rachael Fee, MD 07/29/2012 12:41 PM

## 2012-07-29 NOTE — Anesthesia Postprocedure Evaluation (Signed)
  Anesthesia Post-op Note  Patient: Micheal Hawkins  Procedure(s) Performed: Procedure(s) (LRB): UPPER ENDOSCOPIC ULTRASOUND (EUS) LINEAR (N/A)  Patient Location: PACU  Anesthesia Type: MAC  Level of Consciousness: awake and alert   Airway and Oxygen Therapy: Patient Spontanous Breathing  Post-op Pain: mild  Post-op Assessment: Post-op Vital signs reviewed, Patient's Cardiovascular Status Stable, Respiratory Function Stable, Patent Airway and No signs of Nausea or vomiting  Last Vitals:  Filed Vitals:   07/29/12 1235  BP: 87/50  Pulse:   Temp:   Resp: 16    Post-op Vital Signs: stable   Complications: No apparent anesthesia complications

## 2012-07-29 NOTE — Preoperative (Signed)
Beta Blockers   Reason not to administer Beta Blockers:Not Applicable Pt took beta blocker today 

## 2012-08-18 ENCOUNTER — Telehealth: Payer: Self-pay | Admitting: *Deleted

## 2012-08-18 MED ORDER — FUROSEMIDE 20 MG PO TABS: 20.0000 mg | ORAL_TABLET | Freq: Every day | ORAL | Status: DC | PRN

## 2012-08-18 MED ORDER — LISINOPRIL 5 MG PO TABS: 5.0000 mg | ORAL_TABLET | Freq: Every morning | ORAL | Status: DC

## 2012-08-18 NOTE — Telephone Encounter (Signed)
Medication sent via escribe.  

## 2012-08-31 ENCOUNTER — Other Ambulatory Visit: Payer: Self-pay | Admitting: Urology

## 2012-08-31 ENCOUNTER — Ambulatory Visit (INDEPENDENT_AMBULATORY_CARE_PROVIDER_SITE_OTHER): Payer: Medicare Other | Admitting: Urology

## 2012-08-31 DIAGNOSIS — E291 Testicular hypofunction: Secondary | ICD-10-CM

## 2012-08-31 DIAGNOSIS — C61 Malignant neoplasm of prostate: Secondary | ICD-10-CM

## 2012-09-08 ENCOUNTER — Ambulatory Visit (HOSPITAL_COMMUNITY)
Admission: RE | Admit: 2012-09-08 | Discharge: 2012-09-08 | Disposition: A | Discharge status: Home / Self Care | Payer: Medicare Other | Source: Ambulatory Visit | Attending: Urology | Admitting: Urology

## 2012-09-08 DIAGNOSIS — E291 Testicular hypofunction: Secondary | ICD-10-CM

## 2012-09-08 DIAGNOSIS — M899 Disorder of bone, unspecified: Secondary | ICD-10-CM | POA: Insufficient documentation

## 2012-09-14 ENCOUNTER — Telehealth: Payer: Self-pay | Admitting: Internal Medicine

## 2012-09-14 NOTE — Telephone Encounter (Signed)
Patient had an EUS with Dr. Christella Hartigan in April 2014 does he need a CT please advise?

## 2012-09-14 NOTE — Telephone Encounter (Signed)
Message copied by Glendora Score on Tue Sep 14, 2012  4:54 PM ------      Message from: Tonye Pearson      Created: Tue Sep 14, 2012  4:01 PM       CT ABD for Pancreatic ------

## 2012-09-15 ENCOUNTER — Encounter: Payer: Self-pay | Admitting: Gastroenterology

## 2012-09-15 ENCOUNTER — Other Ambulatory Visit: Payer: Self-pay | Admitting: Gastroenterology

## 2012-09-15 NOTE — Telephone Encounter (Signed)
Letter has been mailed to the patient to call and schedule

## 2012-09-15 NOTE — Telephone Encounter (Signed)
Patient needs CT abdomen pancreatic protocol first of 10/2012.  This is follow up atypical cells found at time of EUS and dilated pancreatic duct, stranding around pancreas, portacaval node enlargement seen on prior CT.

## 2012-09-20 ENCOUNTER — Other Ambulatory Visit: Payer: Self-pay | Admitting: Gastroenterology

## 2012-09-20 ENCOUNTER — Other Ambulatory Visit: Payer: Self-pay | Admitting: Internal Medicine

## 2012-09-20 DIAGNOSIS — R932 Abnormal findings on diagnostic imaging of liver and biliary tract: Secondary | ICD-10-CM

## 2012-09-23 ENCOUNTER — Ambulatory Visit (HOSPITAL_COMMUNITY): Payer: Medicare Other

## 2012-10-11 ENCOUNTER — Ambulatory Visit (HOSPITAL_COMMUNITY)
Admission: RE | Admit: 2012-10-11 | Discharge: 2012-10-11 | Disposition: A | Discharge status: Home / Self Care | Payer: Medicare Other | Source: Ambulatory Visit | Attending: Gastroenterology | Admitting: Gastroenterology

## 2012-10-11 DIAGNOSIS — R932 Abnormal findings on diagnostic imaging of liver and biliary tract: Secondary | ICD-10-CM

## 2012-10-11 DIAGNOSIS — K8689 Other specified diseases of pancreas: Secondary | ICD-10-CM | POA: Insufficient documentation

## 2012-10-11 DIAGNOSIS — R599 Enlarged lymph nodes, unspecified: Secondary | ICD-10-CM | POA: Insufficient documentation

## 2012-10-11 MED ORDER — IOHEXOL 300 MG/ML  SOLN
100.0000 mL | Freq: Once | INTRAMUSCULAR | Status: AC | PRN
  Administered 2012-10-11: 100 mL via INTRAVENOUS

## 2012-10-13 NOTE — Progress Notes (Signed)
Quick Note:  This was for Dr. Jena Gauss, per his recommendations.  I will forward for review and RECOMMENDATIONS. May need MRI/MRCP and/or EGD. ______

## 2012-10-18 ENCOUNTER — Telehealth: Payer: Self-pay | Admitting: Internal Medicine

## 2012-10-18 NOTE — Telephone Encounter (Signed)
Pt calling to check on CT results from last week. Please call when available. [phone]

## 2012-10-19 NOTE — Telephone Encounter (Signed)
Dr. Jena Gauss, LSL was forwarding this pts CT results to you for recommendations.

## 2012-10-27 NOTE — Progress Notes (Signed)
Quick Note:  Reminder appt made ______ 

## 2012-11-16 ENCOUNTER — Ambulatory Visit (INDEPENDENT_AMBULATORY_CARE_PROVIDER_SITE_OTHER): Payer: Medicare Other | Admitting: Urology

## 2012-11-16 DIAGNOSIS — E291 Testicular hypofunction: Secondary | ICD-10-CM

## 2012-11-16 DIAGNOSIS — C61 Malignant neoplasm of prostate: Secondary | ICD-10-CM

## 2013-01-04 ENCOUNTER — Ambulatory Visit (INDEPENDENT_AMBULATORY_CARE_PROVIDER_SITE_OTHER): Payer: Medicare Other | Admitting: Urology

## 2013-01-04 DIAGNOSIS — M899 Disorder of bone, unspecified: Secondary | ICD-10-CM

## 2013-01-04 DIAGNOSIS — E291 Testicular hypofunction: Secondary | ICD-10-CM

## 2013-01-04 DIAGNOSIS — C61 Malignant neoplasm of prostate: Secondary | ICD-10-CM

## 2013-01-14 ENCOUNTER — Other Ambulatory Visit: Payer: Self-pay | Admitting: Cardiology

## 2013-02-01 ENCOUNTER — Ambulatory Visit (HOSPITAL_COMMUNITY)
Admission: RE | Admit: 2013-02-01 | Discharge: 2013-02-01 | Disposition: A | Discharge status: Home / Self Care | Payer: Medicare Other | Source: Ambulatory Visit | Attending: Family Medicine | Admitting: Family Medicine

## 2013-02-01 ENCOUNTER — Other Ambulatory Visit (HOSPITAL_COMMUNITY): Payer: Self-pay | Admitting: Family Medicine

## 2013-02-01 ENCOUNTER — Encounter: Payer: Self-pay | Admitting: Internal Medicine

## 2013-02-01 DIAGNOSIS — Z87891 Personal history of nicotine dependence: Secondary | ICD-10-CM | POA: Insufficient documentation

## 2013-02-01 DIAGNOSIS — J4489 Other specified chronic obstructive pulmonary disease: Secondary | ICD-10-CM | POA: Insufficient documentation

## 2013-02-01 DIAGNOSIS — I2510 Atherosclerotic heart disease of native coronary artery without angina pectoris: Secondary | ICD-10-CM

## 2013-02-01 DIAGNOSIS — C61 Malignant neoplasm of prostate: Secondary | ICD-10-CM | POA: Insufficient documentation

## 2013-02-01 DIAGNOSIS — I10 Essential (primary) hypertension: Secondary | ICD-10-CM | POA: Insufficient documentation

## 2013-02-01 DIAGNOSIS — J449 Chronic obstructive pulmonary disease, unspecified: Secondary | ICD-10-CM

## 2013-02-01 DIAGNOSIS — Z0000 Encounter for general adult medical examination without abnormal findings: Secondary | ICD-10-CM

## 2013-02-24 ENCOUNTER — Ambulatory Visit: Payer: Medicare Other | Admitting: Gastroenterology

## 2013-04-06 ENCOUNTER — Telehealth: Payer: Self-pay | Admitting: Internal Medicine

## 2013-04-06 ENCOUNTER — Encounter: Payer: Self-pay | Admitting: Internal Medicine

## 2013-04-06 NOTE — Telephone Encounter (Signed)
Feb recall has that patient needs MRI of Pancreas in 6 months

## 2013-04-06 NOTE — Telephone Encounter (Signed)
I have mailed patient a letter

## 2013-04-12 ENCOUNTER — Telehealth: Payer: Self-pay | Admitting: Internal Medicine

## 2013-04-12 ENCOUNTER — Other Ambulatory Visit: Payer: Self-pay | Admitting: Internal Medicine

## 2013-04-12 DIAGNOSIS — K8689 Other specified diseases of pancreas: Secondary | ICD-10-CM

## 2013-04-12 NOTE — Telephone Encounter (Signed)
Pt's wife called regarding get a letter for patient to schedule his MRI. I told her that the referral coordinator was at lunch and would have her call patient back. [phone]

## 2013-04-12 NOTE — Telephone Encounter (Signed)
I spoke to his wife and MRI is scheduled for Tues Feb 10th at 9:00 and she is aware that he needs creatinine ahead of time.

## 2013-04-14 LAB — CREATININE, SERUM: Creat: 0.59 mg/dL (ref 0.50–1.35)

## 2013-04-16 ENCOUNTER — Other Ambulatory Visit: Payer: Self-pay | Admitting: Cardiology

## 2013-04-19 ENCOUNTER — Other Ambulatory Visit: Payer: Self-pay | Admitting: Internal Medicine

## 2013-04-19 ENCOUNTER — Ambulatory Visit (HOSPITAL_COMMUNITY)
Admission: RE | Admit: 2013-04-19 | Discharge: 2013-04-19 | Disposition: A | Discharge status: Home / Self Care | Payer: Medicare Other | Source: Ambulatory Visit | Attending: Internal Medicine | Admitting: Internal Medicine

## 2013-04-19 DIAGNOSIS — K7689 Other specified diseases of liver: Secondary | ICD-10-CM | POA: Insufficient documentation

## 2013-04-19 DIAGNOSIS — K8689 Other specified diseases of pancreas: Secondary | ICD-10-CM

## 2013-04-19 DIAGNOSIS — K8020 Calculus of gallbladder without cholecystitis without obstruction: Secondary | ICD-10-CM | POA: Insufficient documentation

## 2013-04-19 MED ORDER — GADOBENATE DIMEGLUMINE 529 MG/ML IV SOLN
10.0000 mL | Freq: Once | INTRAVENOUS | Status: AC | PRN
  Administered 2013-04-19: 10 mL via INTRAVENOUS

## 2013-05-10 ENCOUNTER — Ambulatory Visit (INDEPENDENT_AMBULATORY_CARE_PROVIDER_SITE_OTHER): Payer: Medicare Other | Admitting: Urology

## 2013-05-10 DIAGNOSIS — C61 Malignant neoplasm of prostate: Secondary | ICD-10-CM

## 2013-05-20 ENCOUNTER — Other Ambulatory Visit: Payer: Self-pay | Admitting: Cardiology

## 2013-05-20 ENCOUNTER — Telehealth: Payer: Self-pay | Admitting: Cardiology

## 2013-05-20 MED ORDER — LISINOPRIL 5 MG PO TABS: 5.0000 mg | ORAL_TABLET | Freq: Every day | ORAL | Status: DC

## 2013-05-20 NOTE — Telephone Encounter (Signed)
Lisinopril refilled.

## 2013-05-20 NOTE — Telephone Encounter (Signed)
Received fax refill request  Rx # [id number] Medication:  Llisinopril 5 mg tab Qty 90 Sig:  Take one tablet by mouth once daily in the morning Physician:  Lattie Haw

## 2013-05-26 ENCOUNTER — Encounter: Payer: Self-pay | Admitting: Cardiology

## 2013-06-13 ENCOUNTER — Ambulatory Visit: Payer: Medicare Other | Admitting: Cardiology

## 2013-07-05 ENCOUNTER — Ambulatory Visit (INDEPENDENT_AMBULATORY_CARE_PROVIDER_SITE_OTHER): Payer: Medicare Other | Admitting: Cardiology

## 2013-07-05 ENCOUNTER — Encounter: Payer: Self-pay | Admitting: Cardiology

## 2013-07-05 VITALS — BP 82/50 | HR 51 | Ht 67.0 in | Wt 109.0 lb

## 2013-07-05 DIAGNOSIS — I7090 Unspecified atherosclerosis: Secondary | ICD-10-CM

## 2013-07-05 DIAGNOSIS — I2510 Atherosclerotic heart disease of native coronary artery without angina pectoris: Secondary | ICD-10-CM

## 2013-07-05 DIAGNOSIS — I10 Essential (primary) hypertension: Secondary | ICD-10-CM

## 2013-07-05 DIAGNOSIS — I509 Heart failure, unspecified: Secondary | ICD-10-CM

## 2013-07-05 DIAGNOSIS — E785 Hyperlipidemia, unspecified: Secondary | ICD-10-CM

## 2013-07-05 MED ORDER — LISINOPRIL 5 MG PO TABS: 2.5000 mg | ORAL_TABLET | Freq: Every day | ORAL | Status: DC

## 2013-07-05 MED ORDER — METOPROLOL TARTRATE 12.5 MG HALF TABLET: 12.5000 mg | ORAL_TABLET | Freq: Two times a day (BID) | ORAL | Status: DC

## 2013-07-05 MED ORDER — METOPROLOL TARTRATE 25 MG PO TABS: 12.5000 mg | ORAL_TABLET | Freq: Two times a day (BID) | ORAL | Status: DC

## 2013-07-05 NOTE — Progress Notes (Signed)
Clinical Summary [name] is a 80 y.o.male former patient of Dr Lattie Haw, this is our first visit together.  1. CAD - hx as described below - an LVEF of 40% is described in the clinic notes, however do not see a corresponding study in the system  - denies any chest pain. No SOB or DOE. He is very active working in the yard without symptoms. No orthopnea, no PND, no LE edema - compliant with meds  2. HTN - does not check at home - denies any lightheadedness or dizziness  3. Hyperlipidemia - compliant with lipitor - no recent panel in our system, reports recent panel with PCP  4. Sinus bradycardia - denies any symptoms - he is on low dose lopressor - TSH 2.999 from prior labs  5. Prostate CA - followed by urology Past Medical History  Diagnosis Date  . COPD (chronic obstructive pulmonary disease)   . Arteriosclerotic cardiovascular disease (ASCVD)     H/O CHF in 1995; CABG 1988;NSTEMI in 02/2009-recurrent 3-VD, unfavorable for PCI-TO of the SVG to the OM1&2, TO SVG to the PDA and PL [place] Kafer of the RCA, patent LIMA to LAD and SVG to D1 and D2; continuing exertional angina with EF40%  . Hypertension   . Hyperlipidemia   . Tobacco abuse, in remission     40-pack-year consumption  . Peptic ulcer disease 1970    Gastric ulcer; required surgical intervention  . Prostate carcinoma 2003    RT     No Known Allergies   Current Outpatient Prescriptions  Medication Sig Dispense Refill  . albuterol (PROVENTIL HFA;VENTOLIN HFA) 108 (90 BASE) MCG/ACT inhaler Inhale 2 puffs into the lungs every 6 (six) hours as needed for wheezing.      Marland Kitchen ALPRAZolam (XANAX) 0.5 MG tablet Take 0.5 mg by mouth daily as needed for sleep or anxiety.      Marland Kitchen aspirin EC 81 MG tablet Take 81 mg by mouth daily.      Marland Kitchen atorvastatin (LIPITOR) 40 MG tablet Take 40 mg by mouth every morning.       . docusate sodium (COLACE) 100 MG capsule Take 100 mg by mouth daily as needed for constipation.      .  Fluticasone-Salmeterol (ADVAIR) 250-50 MCG/DOSE AEPB Inhale 1 puff into the lungs every 12 (twelve) hours as needed (shortness of breath).      . furosemide (LASIX) 20 MG tablet TAKE ONE TABLET BY MOUTH ONCE DAILY AS NEEDED FOR  FLUID  30 tablet  2  . KLOR-CON M10 10 MEQ tablet TAKE ONE TABLET BY MOUTH TWICE DAILY AS NEEDED - WHEN  TAKING  FLUID  PILL  LASIX  (FUROSEMIDE)  30 tablet  11  . lisinopril (PRINIVIL,ZESTRIL) 5 MG tablet Take 1 tablet (5 mg total) by mouth daily.  90 tablet  3  . metoprolol tartrate (LOPRESSOR) 25 MG tablet Take 25 mg by mouth 2 (two) times daily.      . metoprolol tartrate (LOPRESSOR) 25 MG tablet TAKE ONE TABLET BY MOUTH TWICE DAILY  62 tablet  11  . Multiple Vitamin (MULTIVITAMIN WITH MINERALS) TABS Take 1 tablet by mouth daily.      . [DISCONTINUED] potassium chloride (K-DUR) 10 MEQ tablet Take 1 tablet (10 mEq total) by mouth 2 (two) times daily as needed. When taking fluid pill - lasix.  30 tablet  12   No current facility-administered medications for this visit.     Past Surgical History  Procedure Laterality  Date  . Repair of perforated ulcer  1970    PUD  . Colonoscopy  09/27/2002    [id number] rectum and colon  . Colonoscopy  02/26/2011    RMR: Single anal papilla/Sigmoid diverticula/otherwise normal rectum. TI normal.   . Coronary artery bypass graft  1998    x 4   . Eus N/A 07/29/2012    Procedure: UPPER ENDOSCOPIC ULTRASOUND (EUS) LINEAR;  Surgeon: Milus Banister, MD;  Location: [facility] ENDOSCOPY;  Service: Endoscopy;  Laterality: N/A;     No Known Allergies    Family History  Problem Relation Age of Onset  . Coronary artery disease Mother   . Stroke Mother   . Bone cancer Father   . COPD Sister   . Coronary artery disease Sister   . Liver disease Neg Hx   . Pancreatic cancer Neg Hx   . Colon cancer Neg Hx      Social History [name] reports that he quit smoking about 20 years ago. His smoking use included Cigarettes. He has a 40  pack-year smoking history. He quit smokeless tobacco use about 20 years ago. [name] reports that he does not drink alcohol.   Review of Systems CONSTITUTIONAL: No weight loss, fever, chills, weakness or fatigue.  HEENT: Eyes: No visual loss, blurred vision, double vision or yellow sclerae.No hearing loss, sneezing, congestion, runny nose or sore throat.  SKIN: No rash or itching.  CARDIOVASCULAR: per HPI RESPIRATORY: No shortness of breath, cough or sputum.  GASTROINTESTINAL: No anorexia, nausea, vomiting or diarrhea. No abdominal pain or blood.  GENITOURINARY: No burning on urination, no polyuria NEUROLOGICAL: No headache, dizziness, syncope, paralysis, ataxia, numbness or tingling in the extremities. No change in bowel or bladder control.  MUSCULOSKELETAL: No muscle, back pain, joint pain or stiffness.  LYMPHATICS: No enlarged nodes. No history of splenectomy.  PSYCHIATRIC: No history of depression or anxiety.  ENDOCRINOLOGIC: No reports of sweating, cold or heat intolerance. No polyuria or polydipsia.  Marland Kitchen   Physical Examination p 51 bp 90/60 Wt 109 lbs BMI 17 Gen: resting comfortably, no acute distress HEENT: no scleral icterus, pupils equal round and reactive, no palptable cervical adenopathy,  CV Resp: Clear to auscultation bilaterally GI: abdomen is soft, non-tender, non-distended, normal bowel sounds, no hepatosplenomegaly MSK: extremities are warm, no edema.  Skin: warm, no rash Neuro:  no focal deficits Psych: appropriate affect   Diagnostic Studies  07/05/13 Clinic EKG Sinus brady rate 55   Assessment and Plan  1. CAD - no current symptoms, continue risk factor modification and secondary prevention - clinic notes describe LVEF of 40%, there are no clear reports in ours system what is actual LVEF is. Will obtain echo, if truly decreased will need to later medical therapy including change his beta blocker  2. HTN - low bp in clinic today, will decrease  lopressor to 12.5mg  bid and lisinopril to 2.5mg  qday   3. Hyperlipidemia - will requesti most recent panel from PCP  4. Sinus bradycardia - asymptomatic, will decrease lopressor   F/u 3 months    Arnoldo Lenis, M.D., F.A.C.C.

## 2013-07-05 NOTE — Patient Instructions (Addendum)
Your physician recommends that you schedule a follow-up appointment in: 3 months   Your physician has recommended you make the following change in your medication:     DECREASE Metoprolol to 12.5 mg twice a day   DECREASE Lisinopril to 2.5 mg daily  Your physician has requested that you have an echocardiogram. Echocardiography is a painless test that uses sound waves to create images of your heart. It provides your doctor with information about the size and shape of your heart and how well your heart's chambers and valves are working. This procedure takes approximately one hour. There are no restrictions for this procedure.    Thank you for choosing [facility] !

## 2013-07-07 ENCOUNTER — Ambulatory Visit (HOSPITAL_COMMUNITY)
Admission: RE | Admit: 2013-07-07 | Discharge: 2013-07-07 | Disposition: A | Discharge status: Home / Self Care | Payer: Medicare Other | Source: Ambulatory Visit | Attending: Cardiology | Admitting: Cardiology

## 2013-07-07 DIAGNOSIS — E785 Hyperlipidemia, unspecified: Secondary | ICD-10-CM | POA: Insufficient documentation

## 2013-07-07 DIAGNOSIS — Z8546 Personal history of malignant neoplasm of prostate: Secondary | ICD-10-CM | POA: Insufficient documentation

## 2013-07-07 DIAGNOSIS — J4489 Other specified chronic obstructive pulmonary disease: Secondary | ICD-10-CM | POA: Insufficient documentation

## 2013-07-07 DIAGNOSIS — I10 Essential (primary) hypertension: Secondary | ICD-10-CM | POA: Insufficient documentation

## 2013-07-07 DIAGNOSIS — Z87891 Personal history of nicotine dependence: Secondary | ICD-10-CM | POA: Insufficient documentation

## 2013-07-07 DIAGNOSIS — J449 Chronic obstructive pulmonary disease, unspecified: Secondary | ICD-10-CM | POA: Insufficient documentation

## 2013-07-07 DIAGNOSIS — R634 Abnormal weight loss: Secondary | ICD-10-CM | POA: Insufficient documentation

## 2013-07-07 DIAGNOSIS — Z951 Presence of aortocoronary bypass graft: Secondary | ICD-10-CM | POA: Insufficient documentation

## 2013-07-07 DIAGNOSIS — I509 Heart failure, unspecified: Secondary | ICD-10-CM | POA: Insufficient documentation

## 2013-07-07 DIAGNOSIS — I2510 Atherosclerotic heart disease of native coronary artery without angina pectoris: Secondary | ICD-10-CM | POA: Insufficient documentation

## 2013-07-07 DIAGNOSIS — I369 Nonrheumatic tricuspid valve disorder, unspecified: Secondary | ICD-10-CM

## 2013-07-07 NOTE — Progress Notes (Signed)
*  PRELIMINARY RESULTS* Echocardiogram 2D Echocardiogram has been performed.  Elvia Collum 07/07/2013, 10:43 AM

## 2013-07-11 ENCOUNTER — Telehealth: Payer: Self-pay

## 2013-07-11 MED ORDER — METOPROLOL SUCCINATE ER 25 MG PO TB24: 12.5000 mg | ORAL_TABLET | Freq: Every day | ORAL | Status: DC

## 2013-07-11 NOTE — Telephone Encounter (Signed)
Spoke with pt's wife as he is HOH, medication changed from lopressor to toprol xl

## 2013-07-11 NOTE — Telephone Encounter (Signed)
Message copied by Bernita Raisin on Mon Jul 11, 2013  6:01 PM ------      Message from: [place] F      Created: Mon Jul 11, 2013  9:05 AM       Please let patient know that his echo is stable, showing that the pumping function of his heart is mildly decreased. Please stop his lopressor and start Toprol XL 12.5mg  daily.            Zandra Abts MD ------

## 2013-07-13 ENCOUNTER — Telehealth: Payer: Self-pay | Admitting: *Deleted

## 2013-07-13 NOTE — Telephone Encounter (Signed)
Made pt aware of medications that the pt should be on. Toprol XL 12.5 daily and Lisinopril 2.5 mg daily.

## 2013-07-13 NOTE — Telephone Encounter (Signed)
Pt needs to go over his medication that were changed at the last visit. He walked in a bit confused because the paper they left with is different from what the nurse had called them to go over as far a changes go.  Please call patient to clarify what medications were stopped and what he should still be taking.

## 2013-07-18 ENCOUNTER — Telehealth: Payer: Self-pay | Admitting: *Deleted

## 2013-07-18 MED ORDER — FUROSEMIDE 20 MG PO TABS: 20.0000 mg | ORAL_TABLET | Freq: Every day | ORAL | Status: DC | PRN

## 2013-07-18 NOTE — Telephone Encounter (Signed)
Furosemide 20 mg #30

## 2013-09-20 ENCOUNTER — Ambulatory Visit (INDEPENDENT_AMBULATORY_CARE_PROVIDER_SITE_OTHER): Payer: Medicare Other | Admitting: Urology

## 2013-09-20 DIAGNOSIS — C61 Malignant neoplasm of prostate: Secondary | ICD-10-CM

## 2013-09-20 DIAGNOSIS — M949 Disorder of cartilage, unspecified: Secondary | ICD-10-CM

## 2013-09-20 DIAGNOSIS — M899 Disorder of bone, unspecified: Secondary | ICD-10-CM

## 2013-09-20 DIAGNOSIS — E291 Testicular hypofunction: Secondary | ICD-10-CM

## 2013-10-03 ENCOUNTER — Encounter: Payer: Self-pay | Admitting: Cardiology

## 2013-10-03 ENCOUNTER — Ambulatory Visit (INDEPENDENT_AMBULATORY_CARE_PROVIDER_SITE_OTHER): Payer: Medicare Other | Admitting: Cardiology

## 2013-10-03 VITALS — BP 110/60 | HR 60 | Ht 67.0 in | Wt 107.0 lb

## 2013-10-03 DIAGNOSIS — I2510 Atherosclerotic heart disease of native coronary artery without angina pectoris: Secondary | ICD-10-CM

## 2013-10-03 DIAGNOSIS — I10 Essential (primary) hypertension: Secondary | ICD-10-CM

## 2013-10-03 DIAGNOSIS — E785 Hyperlipidemia, unspecified: Secondary | ICD-10-CM

## 2013-10-03 DIAGNOSIS — I2589 Other forms of chronic ischemic heart disease: Secondary | ICD-10-CM

## 2013-10-03 DIAGNOSIS — I255 Ischemic cardiomyopathy: Secondary | ICD-10-CM

## 2013-10-03 MED ORDER — METOPROLOL SUCCINATE ER 25 MG PO TB24: 12.5000 mg | ORAL_TABLET | Freq: Two times a day (BID) | ORAL | Status: DC

## 2013-10-03 NOTE — Progress Notes (Signed)
Clinical Summary Mr. Dragoo is a 80 y.o.male seen today for follow up of the following medical problems.  1. CAD/ICM - hx of CAD as described below  - echo 06/2013 LVEF 40-45%, multiple WMAs.   - denies any chest pain. No SOB or DOE. He is very active working in the yard without symptoms. No orthopnea, no PND, no LE edema  - compliant with meds. Limiting sodium intake.  - last visit after repeat echo showed continued LV systolic dysfunction changed to Toprol XL and started on lisinopril, denies any side effects. Notes some occasoinal palpitations at night since changing from lopressor bid to Toprol XL qday.  2. HTN  - does not check at home  - compliant with meds  3. Hyperlipidemia  - compliant with lipitor  - last panel 01/2013 TC 90 TG 52 LDL 34  4. Prostate CA  - followed by urology    Past Medical History  Diagnosis Date  . COPD (chronic obstructive pulmonary disease)   . Arteriosclerotic cardiovascular disease (ASCVD)     H/O CHF in 1995; CABG 1988;NSTEMI in 02/2009-recurrent 3-VD, unfavorable for PCI-TO of the SVG to the OM1&2, TO SVG to the PDA and PL Jamiere Gulas of the RCA, patent LIMA to LAD and SVG to D1 and D2; continuing exertional angina with EF40%  . Hypertension   . Hyperlipidemia   . Tobacco abuse, in remission     40-pack-year consumption  . Peptic ulcer disease 1970    Gastric ulcer; required surgical intervention  . Prostate carcinoma 2003    RT     No Known Allergies   Current Outpatient Prescriptions  Medication Sig Dispense Refill  . albuterol (PROVENTIL HFA;VENTOLIN HFA) 108 (90 BASE) MCG/ACT inhaler Inhale 2 puffs into the lungs every 6 (six) hours as needed for wheezing.      Marland Kitchen ALPRAZolam (XANAX) 0.5 MG tablet Take 0.5 mg by mouth daily as needed for sleep or anxiety.      Marland Kitchen aspirin EC 81 MG tablet Take 81 mg by mouth daily.      Marland Kitchen atorvastatin (LIPITOR) 40 MG tablet Take 40 mg by mouth every morning.       . docusate sodium (COLACE) 100  MG capsule Take 100 mg by mouth daily as needed for constipation.      . Fluticasone-Salmeterol (ADVAIR) 250-50 MCG/DOSE AEPB Inhale 1 puff into the lungs every 12 (twelve) hours as needed (shortness of breath).      . furosemide (LASIX) 20 MG tablet Take 1 tablet (20 mg total) by mouth daily as needed.  30 tablet  3  . KLOR-CON M10 10 MEQ tablet TAKE ONE TABLET BY MOUTH TWICE DAILY AS NEEDED - WHEN  TAKING  FLUID  PILL  LASIX  (FUROSEMIDE)  30 tablet  11  . lisinopril (PRINIVIL,ZESTRIL) 5 MG tablet Take 0.5 tablets (2.5 mg total) by mouth daily.  90 tablet  3  . metoprolol succinate (TOPROL XL) 25 MG 24 hr tablet Take 0.5 tablets (12.5 mg total) by mouth daily.  30 tablet  6  . Multiple Vitamin (MULTIVITAMIN WITH MINERALS) TABS Take 1 tablet by mouth daily.      . [DISCONTINUED] potassium chloride (K-DUR) 10 MEQ tablet Take 1 tablet (10 mEq total) by mouth 2 (two) times daily as needed. When taking fluid pill - lasix.  30 tablet  12   No current facility-administered medications for this visit.     Past Surgical History  Procedure Laterality Date  .  Repair of perforated ulcer  1970    PUD  . Colonoscopy  09/27/2002    [id number] rectum and colon  . Colonoscopy  02/26/2011    RMR: Single anal papilla/Sigmoid diverticula/otherwise normal rectum. TI normal.   . Coronary artery bypass graft  1998    x 4   . Eus N/A 07/29/2012    Procedure: UPPER ENDOSCOPIC ULTRASOUND (EUS) LINEAR;  Surgeon: Milus Banister, MD;  Location: [facility];  Service: Endoscopy;  Laterality: N/A;     No Known Allergies    Family History  Problem Relation Age of Onset  . Coronary artery disease Mother   . Stroke Mother   . Bone cancer Father   . COPD Sister   . Coronary artery disease Sister   . Liver disease Neg Hx   . Pancreatic cancer Neg Hx   . Colon cancer Neg Hx      Social History Mr. Beaver reports that he quit smoking about 20 years ago. His smoking use included Cigarettes. He has a 40  pack-year smoking history. He quit smokeless tobacco use about 20 years ago. Mr. Swigart reports that he does not drink alcohol.   Review of Systems CONSTITUTIONAL: No weight loss, fever, chills, weakness or fatigue.  HEENT: Eyes: No visual loss, blurred vision, double vision or yellow sclerae.No hearing loss, sneezing, congestion, runny nose or sore throat.  SKIN: No rash or itching.  CARDIOVASCULAR: per HPI RESPIRATORY: No shortness of breath, cough or sputum.  GASTROINTESTINAL: No anorexia, nausea, vomiting or diarrhea. No abdominal pain or blood.  GENITOURINARY: No burning on urination, no polyuria NEUROLOGICAL: No headache, dizziness, syncope, paralysis, ataxia, numbness or tingling in the extremities. No change in bowel or bladder control.  MUSCULOSKELETAL: No muscle, back pain, joint pain or stiffness.  LYMPHATICS: No enlarged nodes. No history of splenectomy.  PSYCHIATRIC: No history of depression or anxiety.  ENDOCRINOLOGIC: No reports of sweating, cold or heat intolerance. No polyuria or polydipsia.  Marland Kitchen   Physical Examination p 60 bp 110/60 Wt 107 lbs BMI 17 Gen: resting comfortably, no acute distress HEENT: no scleral icterus, pupils equal round and reactive, no palptable cervical adenopathy,  CV: RRR, no m/r/g, no JVD, no carotid bruits Resp: Clear to auscultation bilaterally GI: abdomen is soft, non-tender, non-distended, normal bowel sounds, no hepatosplenomegaly MSK: extremities are warm, no edema.  Skin: warm, no rash Neuro:  no focal deficits Psych: appropriate affect   Diagnostic Studies  06/2013 Echo Study Conclusions  - Left ventricle: The cavity size was normal. Wall thickness was increased in a pattern of mild LVH. Systolic function was mildly to moderately reduced. The estimated ejection fraction was in the range of 40% to 45%. - Regional wall motion abnormality: Moderate hypokinesis of the apical anterior, mid anteroseptal, and basal inferolateral  myocardium; mild hypokinesis of the mid anterior, mid inferoseptal, and apical myocardium. Diastolic dysfunction, grade indeterminate. - Aortic valve: Mildly calcified annulus. Trileaflet; mildly thickened leaflets. Trivial regurgitation. - Mitral valve: Mildly thickened leaflets . - Right atrium: The atrium was mildly dilated. - Tricuspid valve: Mild regurgitation.     Assessment and Plan  1. CAD/ICM - no current symptoms, continue risk factor modification and secondary prevention  - will increase Toprol XL to 12.5mg  bid, follow how he tolerates. Limited on medicaiton titration due to low normal bp and heart rate  2. HTN  - at goal, continue current meds  3. Hyperlipidemia  - at goal, continue current statin   F/u 4 months  Arnoldo Lenis, M.D., F.A.C.C.

## 2013-10-03 NOTE — Patient Instructions (Signed)
Your physician recommends that you schedule a follow-up appointment in: 4 months with Dr Harl Bowie  Your physician has recommended you make the following change in your medication:   Increased Toprol XL to 12.5 mg twice a day

## 2013-11-15 ENCOUNTER — Telehealth: Payer: Self-pay | Admitting: *Deleted

## 2013-11-15 ENCOUNTER — Other Ambulatory Visit: Payer: Self-pay | Admitting: *Deleted

## 2013-11-15 MED ORDER — METOPROLOL SUCCINATE ER 25 MG PO TB24: ORAL_TABLET | ORAL | Status: DC

## 2013-11-15 NOTE — Telephone Encounter (Signed)
From my notes we had increased his Torpol XL to 12.5mg  bid. Lets have him go back to once a day and follow symptoms.   Zandra Abts MD

## 2013-11-15 NOTE — Telephone Encounter (Signed)
Pt was paying [employer] this morning and the "nurse" there took his BP it was 90/60 he was feeling light headed. Pt wife is calling to see if he needs to come in a see Dr Harl Bowie today

## 2013-11-15 NOTE — Telephone Encounter (Signed)
Pt BP was 90/60 and wife says he is dizzy at times, did not think it was necessary to go to the ED, wanted to know if Dr. Harl Bowie wanted to make a change in medications. Will forward to Dr. Harl Bowie

## 2013-11-15 NOTE — Progress Notes (Signed)
Pt wife understood to change toprol to once daily and changed in medication list.

## 2014-01-17 ENCOUNTER — Other Ambulatory Visit: Payer: Self-pay | Admitting: Urology

## 2014-01-17 ENCOUNTER — Ambulatory Visit (INDEPENDENT_AMBULATORY_CARE_PROVIDER_SITE_OTHER): Payer: Medicare Other | Admitting: Urology

## 2014-01-17 DIAGNOSIS — M8580 Other specified disorders of bone density and structure, unspecified site: Secondary | ICD-10-CM

## 2014-01-17 DIAGNOSIS — C61 Malignant neoplasm of prostate: Secondary | ICD-10-CM

## 2014-02-01 ENCOUNTER — Encounter: Payer: Self-pay | Admitting: Cardiology

## 2014-02-01 ENCOUNTER — Ambulatory Visit (INDEPENDENT_AMBULATORY_CARE_PROVIDER_SITE_OTHER): Payer: Medicare Other | Admitting: Cardiology

## 2014-02-01 VITALS — BP 130/70 | HR 89 | Ht 67.0 in | Wt 103.0 lb

## 2014-02-01 DIAGNOSIS — E785 Hyperlipidemia, unspecified: Secondary | ICD-10-CM

## 2014-02-01 DIAGNOSIS — I2510 Atherosclerotic heart disease of native coronary artery without angina pectoris: Secondary | ICD-10-CM

## 2014-02-01 DIAGNOSIS — I5022 Chronic systolic (congestive) heart failure: Secondary | ICD-10-CM

## 2014-02-01 DIAGNOSIS — I10 Essential (primary) hypertension: Secondary | ICD-10-CM

## 2014-02-01 MED ORDER — METOPROLOL SUCCINATE ER 25 MG PO TB24: 12.5000 mg | ORAL_TABLET | Freq: Two times a day (BID) | ORAL | Status: DC

## 2014-02-01 NOTE — Patient Instructions (Signed)
Your physician recommends that you schedule a follow-up appointment in: 3 months with Dr. Harl Bowie  Your physician has recommended you make the following change in your medication:   TAKE METOPROLOL 12.5 MG TWICE DAILY  Thank you for choosing [facility]!!

## 2014-02-01 NOTE — Progress Notes (Signed)
Clinical Summary [name] is a 80 y.o.male seen today for follow up of the following medical problems.   1. CAD/ICM - hx of CAD with prior CABG  - echo 06/2013 LVEF 40-45%, multiple WMAs.  - denies any chest pain. No SOB or DOE. He remains very active doing fairly labor intense work in the yard without significant limitations. No orthopnea, no PND, no LE edema  - compliant with meds. Limiting sodium intake.   2. HTN  - does not check bp at home  - compliant with meds  3. Hyperlipidemia  - compliant with lipitor, reports upcoming lipid panel with pcp  4. Prostate CA  - followed by urology  Past Medical History  Diagnosis Date  . COPD (chronic obstructive pulmonary disease)   . Arteriosclerotic cardiovascular disease (ASCVD)     H/O CHF in 1995; CABG 1988;NSTEMI in 02/2009-recurrent 3-VD, unfavorable for PCI-TO of the SVG to the OM1&2, TO SVG to the PDA and PL Micheal Hawkins of the RCA, patent LIMA to LAD and SVG to D1 and D2; continuing exertional angina with EF40%  . Hypertension   . Hyperlipidemia   . Tobacco abuse, in remission     40-pack-year consumption  . Peptic ulcer disease 1970    Gastric ulcer; required surgical intervention  . Prostate carcinoma 2003    RT     No Known Allergies   Current Outpatient Prescriptions  Medication Sig Dispense Refill  . albuterol (PROVENTIL HFA;VENTOLIN HFA) 108 (90 BASE) MCG/ACT inhaler Inhale 2 puffs into the lungs every 6 (six) hours as needed for wheezing.    Marland Kitchen ALPRAZolam (XANAX) 0.5 MG tablet Take 0.5 mg by mouth daily as needed for sleep or anxiety.    Marland Kitchen aspirin EC 81 MG tablet Take 81 mg by mouth daily.    Marland Kitchen atorvastatin (LIPITOR) 40 MG tablet Take 40 mg by mouth every morning.     . Fluticasone-Salmeterol (ADVAIR) 250-50 MCG/DOSE AEPB Inhale 1 puff into the lungs every 12 (twelve) hours as needed (shortness of breath).    . furosemide (LASIX) 20 MG tablet Take 1 tablet (20 mg total) by mouth daily as needed. 30  tablet 3  . KLOR-CON M10 10 MEQ tablet TAKE ONE TABLET BY MOUTH TWICE DAILY AS NEEDED - WHEN  TAKING  FLUID  PILL  LASIX  (FUROSEMIDE) 30 tablet 11  . lisinopril (PRINIVIL,ZESTRIL) 5 MG tablet Take 0.5 tablets (2.5 mg total) by mouth daily. 90 tablet 3  . megestrol (MEGACE) 20 MG tablet Take 20 mg by mouth as needed.    . metoprolol succinate (TOPROL XL) 25 MG 24 hr tablet Take 0.5 (1/2 tablet) once daily 60 tablet 6  . Multiple Vitamin (MULTIVITAMIN WITH MINERALS) TABS Take 1 tablet by mouth daily.    . [DISCONTINUED] potassium chloride (K-DUR) 10 MEQ tablet Take 1 tablet (10 mEq total) by mouth 2 (two) times daily as needed. When taking fluid pill - lasix. 30 tablet 12   No current facility-administered medications for this visit.     Past Surgical History  Procedure Laterality Date  . Repair of perforated ulcer  1970    PUD  . Colonoscopy  09/27/2002    [id number] rectum and colon  . Colonoscopy  02/26/2011    RMR: Single anal papilla/Sigmoid diverticula/otherwise normal rectum. TI normal.   . Coronary artery bypass graft  1998    x 4   . Eus N/A 07/29/2012    Procedure: UPPER ENDOSCOPIC ULTRASOUND (EUS) LINEAR;  Surgeon: Milus Banister, MD;  Location: [facility];  Service: Endoscopy;  Laterality: N/A;     No Known Allergies    Family History  Problem Relation Age of Onset  . Coronary artery disease Mother   . Stroke Mother   . Bone cancer Father   . COPD Sister   . Coronary artery disease Sister   . Liver disease Neg Hx   . Pancreatic cancer Neg Hx   . Colon cancer Neg Hx      Social History Micheal Hawkins reports that he quit smoking about 20 years ago. His smoking use included Cigarettes. He has a 40 pack-year smoking history. He quit smokeless tobacco use about 20 years ago. Micheal Hawkins reports that he does not drink alcohol.   Review of Systems CONSTITUTIONAL: No weight loss, fever, chills, weakness or fatigue.  HEENT: Eyes: No visual loss, blurred vision,  double vision or yellow sclerae.No hearing loss, sneezing, congestion, runny nose or sore throat.  SKIN: No rash or itching.  CARDIOVASCULAR: per HPI RESPIRATORY: No shortness of breath, cough or sputum.  GASTROINTESTINAL: No anorexia, nausea, vomiting or diarrhea. No abdominal pain or blood.  GENITOURINARY: No burning on urination, no polyuria NEUROLOGICAL: No headache, dizziness, syncope, paralysis, ataxia, numbness or tingling in the extremities. No change in bowel or bladder control.  MUSCULOSKELETAL: No muscle, back pain, joint pain or stiffness.  LYMPHATICS: No enlarged nodes. No history of splenectomy.  PSYCHIATRIC: No history of depression or anxiety.  ENDOCRINOLOGIC: No reports of sweating, cold or heat intolerance. No polyuria or polydipsia.  Marland Kitchen   Physical Examination p 89 bp 130/70 Wt 103 lbs BMI 16 Gen: resting comfortably, no acute distress HEENT: no scleral icterus, pupils equal round and reactive, no palptable cervical adenopathy,  CV: RRR, no m/r/g, no JVD, no carotid bruits Resp: Clear to auscultation bilaterally GI: abdomen is soft, non-tender, non-distended, normal bowel sounds, no hepatosplenomegaly MSK: extremities are warm, no edema.  Skin: warm, no rash Neuro:  no focal deficits Psych: appropriate affect   Diagnostic Studies 06/2013 Echo Study Conclusions  - Left ventricle: The cavity size was normal. Wall thickness was increased in a pattern of mild LVH. Systolic function was mildly to moderately reduced. The estimated ejection fraction was in the range of 40% to 45%. - Regional wall motion abnormality: Moderate hypokinesis of the apical anterior, mid anteroseptal, and basal inferolateral myocardium; mild hypokinesis of the mid anterior, mid inferoseptal, and apical myocardium. Diastolic dysfunction, grade indeterminate. - Aortic valve: Mildly calcified annulus. Trileaflet; mildly thickened leaflets. Trivial regurgitation. - Mitral valve: Mildly  thickened leaflets . - Right atrium: The atrium was mildly dilated. - Tricuspid valve: Mild regurgitation.    Assessment and Plan   1. CAD/ICM/Chronic systolic HF - no current symptoms, continue risk factor modification and secondary prevention  -  Limited on medicaiton titration due to history of  low normal bp and heart rate. Last visit discussed to increase Toprol XL to 12.5mg  bid however still taking qday, will make change again today  2. HTN  - at goal, continue current meds  3. Hyperlipidemia  - f/u upcoming lipid panel from pcp - continue current statin   F/u 3 months     Arnoldo Lenis, M.D.

## 2014-02-13 ENCOUNTER — Encounter (HOSPITAL_COMMUNITY): Payer: Self-pay

## 2014-02-13 ENCOUNTER — Encounter (HOSPITAL_COMMUNITY)
Admission: RE | Admit: 2014-02-13 | Discharge: 2014-02-13 | Disposition: A | Discharge status: Home / Self Care | Payer: Medicare Other | Source: Ambulatory Visit | Attending: Urology | Admitting: Urology

## 2014-02-13 DIAGNOSIS — C61 Malignant neoplasm of prostate: Secondary | ICD-10-CM | POA: Insufficient documentation

## 2014-02-13 MED ORDER — TECHNETIUM TC 99M MEDRONATE IV KIT
25.0000 | PACK | Freq: Once | INTRAVENOUS | Status: AC | PRN
  Administered 2014-02-13: 25 via INTRAVENOUS

## 2014-02-21 ENCOUNTER — Other Ambulatory Visit (HOSPITAL_COMMUNITY): Payer: Self-pay | Admitting: Physician Assistant

## 2014-02-21 DIAGNOSIS — R945 Abnormal results of liver function studies: Secondary | ICD-10-CM

## 2014-02-22 ENCOUNTER — Inpatient Hospital Stay (HOSPITAL_COMMUNITY)
Admission: RE | Admit: 2014-02-22 | Discharge: 2014-02-22 | Disposition: A | Discharge status: Home / Self Care | Payer: Medicare Other | Source: Ambulatory Visit | Attending: Physician Assistant | Admitting: Physician Assistant

## 2014-02-24 ENCOUNTER — Ambulatory Visit (HOSPITAL_COMMUNITY)
Admission: RE | Admit: 2014-02-24 | Discharge: 2014-02-24 | Disposition: A | Discharge status: Home / Self Care | Payer: Medicare Other | Source: Ambulatory Visit | Attending: Physician Assistant | Admitting: Physician Assistant

## 2014-02-24 ENCOUNTER — Telehealth: Payer: Self-pay | Admitting: Gastroenterology

## 2014-02-24 ENCOUNTER — Other Ambulatory Visit (HOSPITAL_COMMUNITY): Payer: Self-pay | Admitting: Physician Assistant

## 2014-02-24 DIAGNOSIS — R945 Abnormal results of liver function studies: Secondary | ICD-10-CM | POA: Insufficient documentation

## 2014-02-24 DIAGNOSIS — R17 Unspecified jaundice: Secondary | ICD-10-CM | POA: Diagnosis not present

## 2014-02-24 LAB — POCT I-STAT CREATININE: Creatinine, Ser: 0.6 mg/dL (ref 0.50–1.35)

## 2014-02-24 MED ORDER — IOHEXOL 300 MG/ML  SOLN: 100.0000 mL | Freq: Once | INTRAMUSCULAR | Status: AC | PRN

## 2014-02-24 NOTE — Telephone Encounter (Signed)
CALLED BY DR. Ethlyn Gallery. PT HAD CT FOR JAUNDICE. HAS MASS IN HOP/DILATED IH/EH DUCTS. . NO S/SX OF CHOLANGITIS. RECOMMENDED CIPRO 500 MG BID AND CALL DR. Gala Romney AFTER 0830 TO ARRANGE AN ERCP. PT SHOULD GO TO NEAREST ED IF HE DEVELOPS FEVER, CHILLS, OR WORSENING ABDOMINAL PAIN.

## 2014-02-27 NOTE — Telephone Encounter (Signed)
Pt has an appointment on 02/28/14 @ 1030 with [facility]. I will call Larene Pickett to get records.

## 2014-02-27 NOTE — Telephone Encounter (Signed)
I called patient to make him aware of his appointment but he does not want to come in Tuesday. He want to wait until the first week of January. I changed his appointment to January 5 @ 1030. I also informed him that if he was to get worst to go to the ER.

## 2014-02-28 ENCOUNTER — Ambulatory Visit (INDEPENDENT_AMBULATORY_CARE_PROVIDER_SITE_OTHER): Payer: Medicare Other | Admitting: Gastroenterology

## 2014-02-28 ENCOUNTER — Telehealth: Payer: Self-pay | Admitting: Gastroenterology

## 2014-02-28 ENCOUNTER — Encounter: Payer: Self-pay | Admitting: Gastroenterology

## 2014-02-28 ENCOUNTER — Telehealth: Payer: Self-pay | Admitting: Internal Medicine

## 2014-02-28 ENCOUNTER — Ambulatory Visit: Payer: Medicare Other | Admitting: Gastroenterology

## 2014-02-28 ENCOUNTER — Other Ambulatory Visit: Payer: Self-pay

## 2014-02-28 VITALS — BP 96/64 | HR 63 | Temp 96.2°F | Ht 67.0 in | Wt 100.0 lb

## 2014-02-28 DIAGNOSIS — R945 Abnormal results of liver function studies: Principal | ICD-10-CM

## 2014-02-28 DIAGNOSIS — R932 Abnormal findings on diagnostic imaging of liver and biliary tract: Secondary | ICD-10-CM

## 2014-02-28 DIAGNOSIS — R7989 Other specified abnormal findings of blood chemistry: Secondary | ICD-10-CM

## 2014-02-28 DIAGNOSIS — R634 Abnormal weight loss: Secondary | ICD-10-CM

## 2014-02-28 LAB — HEPATIC FUNCTION PANEL
ALT: 118 U/L — ABNORMAL HIGH (ref 0–53)
AST: 204 U/L — ABNORMAL HIGH (ref 0–37)
Albumin: 2.9 g/dL — ABNORMAL LOW (ref 3.5–5.2)
Alkaline Phosphatase: 1017 U/L — ABNORMAL HIGH (ref 39–117)
Bilirubin, Direct: 5.4 mg/dL — ABNORMAL HIGH (ref 0.0–0.3)
Indirect Bilirubin: 2.9 mg/dL — ABNORMAL HIGH (ref 0.2–1.2)
TOTAL PROTEIN: 6.3 g/dL (ref 6.0–8.3)
Total Bilirubin: 8.3 mg/dL — ABNORMAL HIGH (ref 0.3–1.2)

## 2014-02-28 MED ORDER — CIPROFLOXACIN HCL 500 MG PO TABS: 500.0000 mg | ORAL_TABLET | Freq: Two times a day (BID) | ORAL | Status: DC

## 2014-02-28 NOTE — Telephone Encounter (Signed)
Pt was seen in the office today.  

## 2014-02-28 NOTE — Telephone Encounter (Signed)
Referral made to [facility] has an appt with Dr. Elease Hashimoto on 03/02/2014 @ 1030am. Records were faxed to [phone]. Spoke with wife and she is aware of appt. Gave her the phone number in case she needed to rescheduled. Phone is [phone].

## 2014-02-28 NOTE — Progress Notes (Signed)
Quick Note:  LFTs reviewed. Significant bump in LFTs since last drawn 02/08/14. Appt scheduled on 12/24 at [facility].  Lab Results  Component Value Date   ALT 118* 02/28/2014   AST 204* 02/28/2014   ALKPHOS 1017* 02/28/2014   BILITOT 8.3* 02/28/2014   ______

## 2014-02-28 NOTE — Progress Notes (Signed)
Primary Care Physician:  Leonides Grills, MD Primary Gastroenterologist:  Dr. Gala Romney   Chief Complaint  Patient presents with  . Other    liver, jaudice, ctscan    HPI:   Micheal Hawkins is a 80 y.o. male presenting today at the request of Dr. Ethlyn Gallery secondary to jaundice, elevated LFTs, abnormal CT scan. He is well-known to our practice due to a prior history of pancreatic ductal dilatation without identifiable mass. He actually underwent an EUS by Dr. Ardis Hughs in May 2014, but Billroth II anatomy precluded the complete examination (unable to visualize CBD or head of pancreas region). Main pancreatic duct was slightly dilated and appeared to terminate in the body of the pancreas in a region of abnormal but not clearly mass-like parenchyma. FNA biopsy completed with atypical cells. Serial pancreatic imaging recommended. Feb 2015 MRI/MRCP without new findings. Surveillance recommended at that time and slated for 1 year. However, patient was seen by PCP with new onset of jaundice recently. LFTs drawn 02/08/14 with Total bilirubin 1.6, alk phos 565, AST 177, ALT 158. CT 12/18 with NEW diffuse biliary ductal dilatation and chronic diffuse pancreatic ductal dilatation. Ill-defined soft-tissue prominence of pancreatic head noted. Metastatic disease a concern due to lymphadenopathy. New 12 mm spiculated nodule in lateral right lung base with consideration for chest CT or PET CT for further evaluation.   Cipro 500 mg po BID started on 02/23/14 empirically. Patient present with his wife. Denies abdominal pain, fever, chills, N/V, dysphagia. Good appetite but persistent weight loss noted. Chronic diarrhea, improved with Bentyl. No rectal bleeding.  He has lost at least 20 lbs since last May 2014.   Past Medical History  Diagnosis Date  . COPD (chronic obstructive pulmonary disease)   . Arteriosclerotic cardiovascular disease (ASCVD)     H/O CHF in 1995; CABG 1988;NSTEMI in 02/2009-recurrent 3-VD,  unfavorable for PCI-TO of the SVG to the OM1&2, TO SVG to the PDA and PL branch of the RCA, patent LIMA to LAD and SVG to D1 and D2; continuing exertional angina with EF40%  . Hypertension   . Hyperlipidemia   . Tobacco abuse, in remission     40-pack-year consumption  . Peptic ulcer disease 1970    Gastric ulcer; required surgical intervention  . Prostate carcinoma 2003    RT    Past Surgical History  Procedure Laterality Date  . Repair of perforated ulcer  1970    PUD  . Colonoscopy  09/27/2002    [id number] rectum and colon  . Colonoscopy  02/26/2011    RMR: Single anal papilla/Sigmoid diverticula/otherwise normal rectum. TI normal.   . Coronary artery bypass graft  1998    x 4   . Eus N/A 07/29/2012    Dr. Ardis Hughs: Billroth II anatomy precluded complete examination (unable to visualize CBD or head of pancreas region, main pancreatic duct slightly dilated and apperated to terminate in the body of pancreas in a region of abnormal but NOT clearly mass-like parenchyma. Sampled via FNA. Atypical cells. Recommended serial imaging.     Current Outpatient Prescriptions  Medication Sig Dispense Refill  . albuterol (PROVENTIL HFA;VENTOLIN HFA) 108 (90 BASE) MCG/ACT inhaler Inhale 2 puffs into the lungs every 6 (six) hours as needed for wheezing.    Marland Kitchen alendronate (FOSAMAX) 70 MG tablet   5  . ALPRAZolam (XANAX) 0.5 MG tablet Take 0.5 mg by mouth daily as needed for sleep or anxiety.    Marland Kitchen aspirin EC 81 MG tablet Take 81  mg by mouth daily.    . ciprofloxacin (CIPRO) 500 MG tablet Take 1 tablet (500 mg total) by mouth 2 (two) times daily. 14 tablet 0  . dicyclomine (BENTYL) 10 MG capsule Take 10 mg by mouth QID.    Marland Kitchen Fluticasone-Salmeterol (ADVAIR) 250-50 MCG/DOSE AEPB Inhale 1 puff into the lungs every 12 (twelve) hours as needed (shortness of breath).    Marland Kitchen KLOR-CON M10 10 MEQ tablet TAKE ONE TABLET BY MOUTH TWICE DAILY AS NEEDED - WHEN  TAKING  FLUID  PILL  LASIX  (FUROSEMIDE) 30 tablet 11  .  lisinopril (PRINIVIL,ZESTRIL) 5 MG tablet Take 0.5 tablets (2.5 mg total) by mouth daily. 90 tablet 3  . megestrol (MEGACE) 20 MG tablet Take 20 mg by mouth as needed.    . metoprolol succinate (TOPROL XL) 25 MG 24 hr tablet Take 0.5 tablets (12.5 mg total) by mouth 2 (two) times daily. Take 0.5 (1/2 tablet) once daily 60 tablet 6  . Multiple Vitamin (MULTIVITAMIN WITH MINERALS) TABS Take 1 tablet by mouth daily.    . [DISCONTINUED] potassium chloride (K-DUR) 10 MEQ tablet Take 1 tablet (10 mEq total) by mouth 2 (two) times daily as needed. When taking fluid pill - lasix. 30 tablet 12   No current facility-administered medications for this visit.    Allergies as of 02/28/2014  . (No Known Allergies)    Family History  Problem Relation Age of Onset  . Coronary artery disease Mother   . Stroke Mother   . Bone cancer Father   . COPD Sister   . Coronary artery disease Sister   . Liver disease Neg Hx   . Pancreatic cancer Neg Hx   . Colon cancer Neg Hx     History   Social History  . Marital Status: Married    Spouse Name: N/A    Number of Children: 0  . Years of Education: N/A   Occupational History  . Retired     [employer]  . 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 Smoking status: Former Smoker -- 1.00 packs/day for 40 years    Types: Cigarettes    Quit date: 03/10/1993  . Smokeless tobacco: Former Neurosurgeon    Quit date: 04/05/1993  . Alcohol Use: No  . Drug Use: No  . Sexual Activity: Not on file   Other Topics Concern  . Not on file   Social History Narrative    Review of Systems: As mentioned in HPI  Physical Exam: BP 96/64 mmHg  Pulse 63  Temp(Src) 96.2 F (35.7 C)  Ht 5\' 7"  (1.702 m)  Wt 100 lb (45.36 kg)  BMI 15.66 kg/m2 General:   Alert and oriented. Jaundiced.  Head:  Normocephalic and atraumatic. Eyes:  +scleral icterus Ears:  Hard of hearing Nose:  No deformity, discharge,  or  lesions. Mouth:  Pink and moist Neck:  Supple, without mass  Lungs:  Clear to auscultation bilaterally. No wheezes, rales, or rhonchi. No distress.  Heart:  S1, S2 present without murmurs appreciated.  Abdomen:  +BS, soft, non-tender and non-distended. No HSM noted. No guarding or rebound. No masses appreciated.  Rectal:  Deferred  Msk:  Symmetrical without gross deformities. Mild kyphosis Extremities:  Without edema. Neurologic:  Alert and  oriented x4;  grossly normal neurologically. Psych:  Alert and cooperative. Normal mood and affect.  CT 02/24/14:  IMPRESSION: New diffuse biliary ductal dilatation and chronic diffuse  pancreatic ductal dilatation.  Ill-defined soft tissue prominence in the pancreatic head, suspicious for small pancreatic carcinoma. Endoscopic ultrasound suggested for further evaluation.  Mild peripancreatic and porta hepatis lymphadenopathy, suspicious for metastatic disease. No liver metastases identified.  New 12 mm spiculated nodule in lateral right lung base. Differential diagnosis includes bronchogenic carcinoma and infectious/inflammatory etiologies. Consider chest CT or PET-CT for further evaluation.  Moderately enlarged prostate. Mild pelvic ascites.  Cholelithiasis. No radiographic evidence of cholecystitis.

## 2014-02-28 NOTE — Telephone Encounter (Signed)
I spoke with the pts wife, she said the pharmacy told her that they would not be able to pick up the cipro for 7 days and the pt is going to run out on Friday. I called [facility] and they told me that the insurance would pay for it tomorrow and they can pick it up tomorrow. I called and informed the pts wife.

## 2014-02-28 NOTE — Telephone Encounter (Signed)
PATIENT WIFE CALLED ABOUT MEDICATION  REFILL

## 2014-02-28 NOTE — Telephone Encounter (Signed)
Patient has an ill-defined soft tissue prominence of pancreatic head, highly concerning for small pancreatic carcinoma. He has new diffuse biliary ductal dilatation and chronic diffuse pancreatic ductal dilatation. Dr. 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 He does not feel a repeat EUS would be helpful, and an ERCP would be very difficult with the anatomy this patient has. He needs to be seen at a tertiary care facility ASAP. We need to refer to [facility] for an ERCP. If they are unable to do it, needs to be referred to Dr. Gayleen Orem at [facility].

## 2014-02-28 NOTE — Telephone Encounter (Signed)
Noted  

## 2014-02-28 NOTE — Telephone Encounter (Signed)
Please let patient know if he has any change in clinical status such as abdominal pain, fever/chills, needs to seek emergent attention.

## 2014-02-28 NOTE — Assessment & Plan Note (Addendum)
80 year old male with history of abnormal pancreatic ductal dilatation without obvious, identifiable mass in the past, followed with serial imaging, now presenting with acute onset of painless jaundice, new findings of diffuse biliary ductal dilatation and ill-defined soft-tissue prominence of pancreatic head noted. Please see HPI for full history. Discussed with Dr. Ardis Hughs; due to his Billroth II anatomy, an EUS/ERCP will need to be performed at a tertiary care facility. Patient is without concerning signs for cholangitis; continue Cipro 500 mg po BID. 1 week supply refill provided. If any fever, chills, abdominal pain, present to ED. Will refer to [facility] for an urgent evaluation. Check HFP now.   Findings of right lung nodule needs further evaluation after current issue addressed.

## 2014-02-28 NOTE — Telephone Encounter (Signed)
That approach is not recommended

## 2014-02-28 NOTE — Patient Instructions (Signed)
Please have blood work completed today.   Continue Cipro 1 tablet by mouth twice a day. I have provided an additional 7 days so that you do not run out over the weekend.  We will be in touch as soon as possible about the next step.

## 2014-03-01 NOTE — Progress Notes (Signed)
I have reviewed recent CT and other clinical data. Agree with plans for tertiary center referral.

## 2014-03-06 ENCOUNTER — Telehealth: Payer: Self-pay | Admitting: Gastroenterology

## 2014-03-06 NOTE — Telephone Encounter (Signed)
Pt did go for his appointment on 03/02/14 at [facility] and has a follow up appointment in February.

## 2014-03-06 NOTE — Telephone Encounter (Signed)
Did he ever go to [facility]? I am unable to see through care everywhere.

## 2014-03-08 ENCOUNTER — Other Ambulatory Visit (HOSPITAL_COMMUNITY): Payer: Medicare Other

## 2014-03-08 NOTE — Progress Notes (Signed)
cc'ed to pcp °

## 2014-03-14 ENCOUNTER — Ambulatory Visit: Payer: Medicare Other | Admitting: Nurse Practitioner

## 2014-04-11 ENCOUNTER — Telehealth: Payer: Self-pay | Admitting: Internal Medicine

## 2014-04-11 NOTE — Telephone Encounter (Signed)
FEB RECALL FOR MRI

## 2014-04-13 NOTE — Telephone Encounter (Signed)
Orvil Feil, NP  David Towson M Martin[place], LPN           No, that was from a year ago. Since then, I saw him in Dec 2015 and he was referred to [facility] in the interim due to concern for cancer. No MRI needed.       Previous Messages     ----- Message -----   From: Minnie Shi M Martin[place], LPN   Sent: 0/05/7541  3:38 PM    To: Orvil Feil, NP  Subject: MRI ?                       Pt is on recall list for an MRI. I know we have referred him to [facility]. I guess my ? Is do we still need to do this MRI because he had a CT on 02/24/2014   Please Advise.   I may just be confused

## 2014-05-16 ENCOUNTER — Ambulatory Visit (INDEPENDENT_AMBULATORY_CARE_PROVIDER_SITE_OTHER): Payer: Medicare Other | Admitting: Urology

## 2014-05-16 DIAGNOSIS — C61 Malignant neoplasm of prostate: Secondary | ICD-10-CM

## 2014-05-17 ENCOUNTER — Ambulatory Visit (INDEPENDENT_AMBULATORY_CARE_PROVIDER_SITE_OTHER): Payer: Medicare Other | Admitting: Cardiology

## 2014-05-17 ENCOUNTER — Encounter: Payer: Self-pay | Admitting: Cardiology

## 2014-05-17 VITALS — BP 98/60 | HR 76 | Ht 67.0 in | Wt 92.2 lb

## 2014-05-17 DIAGNOSIS — I10 Essential (primary) hypertension: Secondary | ICD-10-CM

## 2014-05-17 NOTE — Progress Notes (Signed)
Clinical Summary Micheal Hawkins is a 81 y.o.male seen today for follow up of the following medical problems.   1. CAD/ICM - hx of CAD with prior CABG  - echo 06/2013 LVEF 40-45%, multiple WMAs.  - denies any chest pain. No SOB/DOE. No orthopnea, PND or LE edema  - compliant with meds. Limiting sodium intake.  - lisinopril prevoiusly stopped due to low bp's  2. HTN  - does not check bp at home  - compliant with meds - lisinopril stopped previously due to low bloop pressures  3. Hyperlipidemia  - compliant with lipitor, reports upcoming lipid panel with pcp  4. Prostate CA  - followed by urology  5. Pancreatic cancer - followed at [facility]   Past Medical History  Diagnosis Date  . COPD (chronic obstructive pulmonary disease)   . Arteriosclerotic cardiovascular disease (ASCVD)     H/O CHF in 1995; CABG 1988;NSTEMI in 02/2009-recurrent 3-VD, unfavorable for PCI-TO of the SVG to the OM1&2, TO SVG to the PDA and PL Micheal Hawkins of the RCA, patent LIMA to LAD and SVG to D1 and D2; continuing exertional angina with EF40%  . Hypertension   . Hyperlipidemia   . Tobacco abuse, in remission     40-pack-year consumption  . Peptic ulcer disease 1970    Gastric ulcer; required surgical intervention  . Prostate carcinoma 2003    RT     No Known Allergies   Current Outpatient Prescriptions  Medication Sig Dispense Refill  . albuterol (PROVENTIL HFA;VENTOLIN HFA) 108 (90 BASE) MCG/ACT inhaler Inhale 2 puffs into the lungs every 6 (six) hours as needed for wheezing.    Marland Kitchen alendronate (FOSAMAX) 70 MG tablet   5  . ALPRAZolam (XANAX) 0.5 MG tablet Take 0.5 mg by mouth daily as needed for sleep or anxiety.    Marland Kitchen aspirin EC 81 MG tablet Take 81 mg by mouth daily.    . ciprofloxacin (CIPRO) 500 MG tablet Take 1 tablet (500 mg total) by mouth 2 (two) times daily. 14 tablet 0  . dicyclomine (BENTYL) 10 MG capsule Take 10 mg by mouth QID.    Marland Kitchen Fluticasone-Salmeterol (ADVAIR) 250-50  MCG/DOSE AEPB Inhale 1 puff into the lungs every 12 (twelve) hours as needed (shortness of breath).    Marland Kitchen KLOR-CON M10 10 MEQ tablet TAKE ONE TABLET BY MOUTH TWICE DAILY AS NEEDED - WHEN  TAKING  FLUID  PILL  LASIX  (FUROSEMIDE) 30 tablet 11  . lisinopril (PRINIVIL,ZESTRIL) 5 MG tablet Take 0.5 tablets (2.5 mg total) by mouth daily. 90 tablet 3  . megestrol (MEGACE) 20 MG tablet Take 20 mg by mouth as needed.    . metoprolol succinate (TOPROL XL) 25 MG 24 hr tablet Take 0.5 tablets (12.5 mg total) by mouth 2 (two) times daily. Take 0.5 (1/2 tablet) once daily 60 tablet 6  . Multiple Vitamin (MULTIVITAMIN WITH MINERALS) TABS Take 1 tablet by mouth daily.    . [DISCONTINUED] potassium chloride (K-DUR) 10 MEQ tablet Take 1 tablet (10 mEq total) by mouth 2 (two) times daily as needed. When taking fluid pill - lasix. 30 tablet 12   No current facility-administered medications for this visit.     Past Surgical History  Procedure Laterality Date  . Repair of perforated ulcer  1970    PUD  . Colonoscopy  09/27/2002    CBJ:SEGBTD rectum and colon  . Colonoscopy  02/26/2011    RMR: Single anal papilla/Sigmoid diverticula/otherwise normal rectum. TI normal.   .  Coronary artery bypass graft  1998    x 4   . Eus N/A 07/29/2012    Dr. Ardis Hughs: Billroth II anatomy precluded complete examination (unable to visualize CBD or head of pancreas region, main pancreatic duct slightly dilated and apperated to terminate in the body of pancreas in a region of abnormal but NOT clearly mass-like parenchyma. Sampled via FNA. Atypical cells. Recommended serial imaging.      No Known Allergies    Family History  Problem Relation Age of Onset  . Coronary artery disease Mother   . Stroke Mother   . Bone cancer Father   . COPD Sister   . Coronary artery disease Sister   . Liver disease Neg Hx   . Pancreatic cancer Neg Hx   . Colon cancer Neg Hx      Social History Micheal Hawkins reports that he quit smoking  about 21 years ago. His smoking use included Cigarettes. He has a 40 pack-year smoking history. He quit smokeless tobacco use about 21 years ago. Micheal Hawkins reports that he does not drink alcohol.   Review of Systems CONSTITUTIONAL: No weight loss, fever, chills, weakness or fatigue.  HEENT: Eyes: No visual loss, blurred vision, double vision or yellow sclerae.No hearing loss, sneezing, congestion, runny nose or sore throat.  SKIN: No rash or itching.  CARDIOVASCULAR: per HPI RESPIRATORY: No shortness of breath, cough or sputum.  GASTROINTESTINAL: No anorexia, nausea, vomiting or diarrhea. No abdominal pain or blood.  GENITOURINARY: No burning on urination, no polyuria NEUROLOGICAL: No headache, dizziness, syncope, paralysis, ataxia, numbness or tingling in the extremities. No change in bowel or bladder control.  MUSCULOSKELETAL: No muscle, back pain, joint pain or stiffness.  LYMPHATICS: No enlarged nodes. No history of splenectomy.  PSYCHIATRIC: No history of depression or anxiety.  ENDOCRINOLOGIC: No reports of sweating, cold or heat intolerance. No polyuria or polydipsia.  Marland Kitchen   Physical Examination p 76 bp 98/60 Wt 92 lbs BMI 14 Gen: resting comfortably, no acute distress HEENT: no scleral icterus, pupils equal round and reactive, no palptable cervical adenopathy,  CV: RRR, no m/r/g, no JVD Resp: Clear to auscultation bilaterally GI: abdomen is soft, non-tender, non-distended, normal bowel sounds, no hepatosplenomegaly MSK: extremities are warm, no edema.  Skin: warm, no rash Neuro:  no focal deficits Psych: appropriate affect   Diagnostic Studies  06/2013 Echo Study Conclusions  - Left ventricle: The cavity size was normal. Wall thickness was increased in a pattern of mild LVH. Systolic function was mildly to moderately reduced. The estimated ejection fraction was in the range of 40% to 45%. - Regional wall motion abnormality: Moderate hypokinesis of the apical  anterior, mid anteroseptal, and basal inferolateral myocardium; mild hypokinesis of the mid anterior, mid inferoseptal, and apical myocardium. Diastolic dysfunction, grade indeterminate. - Aortic valve: Mildly calcified annulus. Trileaflet; mildly thickened leaflets. Trivial regurgitation. - Mitral valve: Mildly thickened leaflets . - Right atrium: The atrium was mildly dilated. - Tricuspid valve: Mild regurgitation.    Assessment and Plan  1. CAD/ICM/Chronic systolic HF - no current symptoms, continue risk factor modification and secondary prevention  - Limited on medicaiton titration due to history of low normal bp and heart rate. Continue current meds  2. HTN  - at goal, continue current meds  3. Hyperlipidemia  - f/u upcoming lipid panel from pcp - continue current statin   F/u 1 year    Micheal Hawkins, M.D.

## 2014-05-17 NOTE — Patient Instructions (Signed)
Your physician wants you to follow-up in: 1 year with DrBranch You will receive a reminder letter in the mail two months in advance. If you don't receive a letter, please call our office to schedule the follow-up appointment.     Your physician recommends that you continue on your current medications as directed. Please refer to the Current Medication list given to you today.      Thank you for choosing [facility] !        

## 2014-08-02 LAB — COMPREHENSIVE METABOLIC PANEL
ALK PHOS: 565 U/L
ALT: 158 U/L — AB (ref 10–40)
AST: 177 U/L
Total Bilirubin: 1.6 mg/dL

## 2014-09-19 ENCOUNTER — Ambulatory Visit (INDEPENDENT_AMBULATORY_CARE_PROVIDER_SITE_OTHER): Payer: Medicare Other | Admitting: Urology

## 2014-09-19 DIAGNOSIS — C61 Malignant neoplasm of prostate: Secondary | ICD-10-CM

## 2014-10-06 ENCOUNTER — Other Ambulatory Visit: Payer: Self-pay

## 2014-10-06 MED ORDER — FUROSEMIDE 20 MG PO TABS: 20.0000 mg | ORAL_TABLET | ORAL | Status: DC | PRN

## 2014-10-12 ENCOUNTER — Other Ambulatory Visit: Payer: Self-pay | Admitting: *Deleted

## 2014-10-12 MED ORDER — FUROSEMIDE 20 MG PO TABS: 20.0000 mg | ORAL_TABLET | ORAL | Status: DC | PRN

## 2014-11-03 ENCOUNTER — Encounter (HOSPITAL_COMMUNITY): Payer: Self-pay | Admitting: Emergency Medicine

## 2014-11-03 ENCOUNTER — Emergency Department (HOSPITAL_COMMUNITY): Payer: Medicare Other

## 2014-11-03 ENCOUNTER — Inpatient Hospital Stay (HOSPITAL_COMMUNITY)
Admission: EM | Admit: 2014-11-03 | Discharge: 2014-11-09 | DRG: 435 | Disposition: E | Payer: Medicare Other | Attending: Internal Medicine | Admitting: Internal Medicine

## 2014-11-03 DIAGNOSIS — E162 Hypoglycemia, unspecified: Secondary | ICD-10-CM | POA: Diagnosis present

## 2014-11-03 DIAGNOSIS — F419 Anxiety disorder, unspecified: Secondary | ICD-10-CM | POA: Diagnosis present

## 2014-11-03 DIAGNOSIS — Z681 Body mass index (BMI) 19 or less, adult: Secondary | ICD-10-CM | POA: Diagnosis not present

## 2014-11-03 DIAGNOSIS — L89101 Pressure ulcer of unspecified part of back, stage 1: Secondary | ICD-10-CM | POA: Diagnosis present

## 2014-11-03 DIAGNOSIS — Z87891 Personal history of nicotine dependence: Secondary | ICD-10-CM | POA: Diagnosis not present

## 2014-11-03 DIAGNOSIS — Z515 Encounter for palliative care: Secondary | ICD-10-CM

## 2014-11-03 DIAGNOSIS — E43 Unspecified severe protein-calorie malnutrition: Secondary | ICD-10-CM | POA: Diagnosis present

## 2014-11-03 DIAGNOSIS — R531 Weakness: Secondary | ICD-10-CM | POA: Diagnosis present

## 2014-11-03 DIAGNOSIS — C7801 Secondary malignant neoplasm of right lung: Secondary | ICD-10-CM | POA: Diagnosis present

## 2014-11-03 DIAGNOSIS — E860 Dehydration: Secondary | ICD-10-CM | POA: Diagnosis present

## 2014-11-03 DIAGNOSIS — R230 Cyanosis: Secondary | ICD-10-CM | POA: Diagnosis present

## 2014-11-03 DIAGNOSIS — E8770 Fluid overload, unspecified: Secondary | ICD-10-CM | POA: Diagnosis present

## 2014-11-03 DIAGNOSIS — I252 Old myocardial infarction: Secondary | ICD-10-CM | POA: Diagnosis not present

## 2014-11-03 DIAGNOSIS — C61 Malignant neoplasm of prostate: Secondary | ICD-10-CM | POA: Diagnosis present

## 2014-11-03 DIAGNOSIS — I2510 Atherosclerotic heart disease of native coronary artery without angina pectoris: Secondary | ICD-10-CM | POA: Diagnosis present

## 2014-11-03 DIAGNOSIS — J449 Chronic obstructive pulmonary disease, unspecified: Secondary | ICD-10-CM | POA: Diagnosis present

## 2014-11-03 DIAGNOSIS — Z825 Family history of asthma and other chronic lower respiratory diseases: Secondary | ICD-10-CM | POA: Diagnosis not present

## 2014-11-03 DIAGNOSIS — D72829 Elevated white blood cell count, unspecified: Secondary | ICD-10-CM | POA: Diagnosis present

## 2014-11-03 DIAGNOSIS — Z808 Family history of malignant neoplasm of other organs or systems: Secondary | ICD-10-CM | POA: Diagnosis not present

## 2014-11-03 DIAGNOSIS — C259 Malignant neoplasm of pancreas, unspecified: Secondary | ICD-10-CM | POA: Diagnosis present

## 2014-11-03 DIAGNOSIS — D638 Anemia in other chronic diseases classified elsewhere: Secondary | ICD-10-CM

## 2014-11-03 DIAGNOSIS — Z8711 Personal history of peptic ulcer disease: Secondary | ICD-10-CM | POA: Diagnosis not present

## 2014-11-03 DIAGNOSIS — Z823 Family history of stroke: Secondary | ICD-10-CM | POA: Diagnosis not present

## 2014-11-03 DIAGNOSIS — I10 Essential (primary) hypertension: Secondary | ICD-10-CM | POA: Diagnosis present

## 2014-11-03 DIAGNOSIS — Z8249 Family history of ischemic heart disease and other diseases of the circulatory system: Secondary | ICD-10-CM

## 2014-11-03 DIAGNOSIS — L8990 Pressure ulcer of unspecified site, unspecified stage: Secondary | ICD-10-CM | POA: Insufficient documentation

## 2014-11-03 DIAGNOSIS — C7802 Secondary malignant neoplasm of left lung: Secondary | ICD-10-CM | POA: Diagnosis present

## 2014-11-03 DIAGNOSIS — Z923 Personal history of irradiation: Secondary | ICD-10-CM | POA: Diagnosis not present

## 2014-11-03 DIAGNOSIS — R627 Adult failure to thrive: Secondary | ICD-10-CM | POA: Diagnosis present

## 2014-11-03 DIAGNOSIS — I959 Hypotension, unspecified: Secondary | ICD-10-CM | POA: Diagnosis present

## 2014-11-03 DIAGNOSIS — Z951 Presence of aortocoronary bypass graft: Secondary | ICD-10-CM

## 2014-11-03 DIAGNOSIS — Z66 Do not resuscitate: Secondary | ICD-10-CM | POA: Diagnosis present

## 2014-11-03 DIAGNOSIS — E785 Hyperlipidemia, unspecified: Secondary | ICD-10-CM | POA: Diagnosis present

## 2014-11-03 LAB — CBC WITH DIFFERENTIAL/PLATELET
Band Neutrophils: 0 % (ref 0–10)
Basophils Absolute: 0 K/uL (ref 0.0–0.1)
Basophils Relative: 0 % (ref 0–1)
Blasts: 0 %
Eosinophils Absolute: 0 K/uL (ref 0.0–0.7)
Eosinophils Relative: 0 % (ref 0–5)
HCT: 22.4 % — ABNORMAL LOW (ref 39.0–52.0)
Hemoglobin: 7.7 g/dL — ABNORMAL LOW (ref 13.0–17.0)
Lymphocytes Relative: 1 % — ABNORMAL LOW (ref 12–46)
Lymphs Abs: 0.2 K/uL — ABNORMAL LOW (ref 0.7–4.0)
MCH: 30.4 pg (ref 26.0–34.0)
MCHC: 34.4 g/dL (ref 30.0–36.0)
MCV: 88.5 fL (ref 78.0–100.0)
Metamyelocytes Relative: 0 %
Monocytes Absolute: 1.1 K/uL — ABNORMAL HIGH (ref 0.1–1.0)
Monocytes Relative: 6 % (ref 3–12)
Myelocytes: 0 %
Neutro Abs: 16.9 K/uL — ABNORMAL HIGH (ref 1.7–7.7)
Neutrophils Relative %: 93 % — ABNORMAL HIGH (ref 43–77)
Other: 0 %
Platelets: 124 K/uL — ABNORMAL LOW (ref 150–400)
Promyelocytes Absolute: 0 %
RBC: 2.53 MIL/uL — ABNORMAL LOW (ref 4.22–5.81)
RDW: 15.7 % — ABNORMAL HIGH (ref 11.5–15.5)
WBC: 18.2 K/uL — ABNORMAL HIGH (ref 4.0–10.5)
nRBC: 0 /100{WBCs}

## 2014-11-03 LAB — COMPREHENSIVE METABOLIC PANEL WITH GFR
ALT: 35 U/L (ref 17–63)
AST: 101 U/L — ABNORMAL HIGH (ref 15–41)
Albumin: 1.3 g/dL — ABNORMAL LOW (ref 3.5–5.0)
Alkaline Phosphatase: 265 U/L — ABNORMAL HIGH (ref 38–126)
Anion gap: 5 (ref 5–15)
BUN: 35 mg/dL — ABNORMAL HIGH (ref 6–20)
CO2: 15 mmol/L — ABNORMAL LOW (ref 22–32)
Calcium: 6.5 mg/dL — ABNORMAL LOW (ref 8.9–10.3)
Chloride: 111 mmol/L (ref 101–111)
Creatinine, Ser: 1.08 mg/dL (ref 0.61–1.24)
GFR calc Af Amer: >60 mL/min (ref >60)
GFR calc non Af Amer: >60 mL/min (ref >60)
Glucose, Bld: 358 mg/dL — ABNORMAL HIGH (ref 65–99)
Potassium: 3.9 mmol/L (ref 3.5–5.1)
Sodium: 131 mmol/L — ABNORMAL LOW (ref 135–145)
Total Bilirubin: 2.4 mg/dL — ABNORMAL HIGH (ref 0.3–1.2)
Total Protein: 4.6 g/dL — ABNORMAL LOW (ref 6.5–8.1)

## 2014-11-03 LAB — CBG MONITORING, ED
Glucose-Capillary: 32 mg/dL — CL (ref 65–99)
Glucose-Capillary: 43 mg/dL — CL (ref 65–99)
Glucose-Capillary: 44 mg/dL — CL (ref 65–99)
Glucose-Capillary: 141 mg/dL — ABNORMAL HIGH (ref 65–99)

## 2014-11-03 MED ORDER — SODIUM CHLORIDE 0.9 % IV SOLN
1000.0000 mL | Freq: Once | INTRAVENOUS | Status: AC
  Administered 2014-11-03: 1000 mL via INTRAVENOUS

## 2014-11-03 MED ORDER — DICYCLOMINE HCL 10 MG PO CAPS: 10.0000 mg | ORAL_CAPSULE | Freq: Four times a day (QID) | ORAL | Status: DC | PRN

## 2014-11-03 MED ORDER — MOMETASONE FURO-FORMOTEROL FUM 100-5 MCG/ACT IN AERO
2.0000 | INHALATION_SPRAY | Freq: Two times a day (BID) | RESPIRATORY_TRACT | Status: DC
  Administered 2014-11-03 – 2014-11-04 (×3): 2 via RESPIRATORY_TRACT
  Filled 2014-11-03: qty 8.8

## 2014-11-03 MED ORDER — DEXTROSE 50 % IV SOLN
INTRAVENOUS | Status: AC
  Administered 2014-11-03: 50 mL
  Filled 2014-11-03: qty 50

## 2014-11-03 MED ORDER — MOMETASONE FURO-FORMOTEROL FUM 100-5 MCG/ACT IN AERO
INHALATION_SPRAY | RESPIRATORY_TRACT | Status: AC
  Filled 2014-11-03: qty 8.8

## 2014-11-03 MED ORDER — ONDANSETRON HCL 4 MG PO TABS: 4.0000 mg | ORAL_TABLET | Freq: Four times a day (QID) | ORAL | Status: DC | PRN

## 2014-11-03 MED ORDER — KCL IN DEXTROSE-NACL 20-5-0.9 MEQ/L-%-% IV SOLN
INTRAVENOUS | Status: DC
  Administered 2014-11-03: 21:00:00 via INTRAVENOUS

## 2014-11-03 MED ORDER — ALBUTEROL SULFATE (2.5 MG/3ML) 0.083% IN NEBU: 3.0000 mL | INHALATION_SOLUTION | Freq: Four times a day (QID) | RESPIRATORY_TRACT | Status: DC | PRN

## 2014-11-03 MED ORDER — ALPRAZOLAM 0.5 MG PO TABS
0.5000 mg | ORAL_TABLET | Freq: Every day | ORAL | Status: DC | PRN
  Administered 2014-11-04: 0.5 mg via ORAL
  Filled 2014-11-03: qty 1

## 2014-11-03 MED ORDER — ONDANSETRON HCL 4 MG/2ML IJ SOLN: 4.0000 mg | Freq: Four times a day (QID) | INTRAMUSCULAR | Status: DC | PRN

## 2014-11-03 MED ORDER — ONDANSETRON HCL 4 MG/2ML IJ SOLN: 4.0000 mg | Freq: Three times a day (TID) | INTRAMUSCULAR | Status: DC | PRN

## 2014-11-03 MED ORDER — KCL IN DEXTROSE-NACL 20-5-0.9 MEQ/L-%-% IV SOLN: INTRAVENOUS | Status: DC

## 2014-11-03 NOTE — ED Notes (Signed)
MD at bedside. 

## 2014-11-03 NOTE — ED Notes (Signed)
PT and family reports generalized weakness worsening x2 days and unable to ambulate with decreased appetite. EMS reports metastasis of pancreatic cancer.

## 2014-11-03 NOTE — ED Provider Notes (Signed)
CSN: [id number]     Arrival date & time 10/12/2014  1651 History   First MD Initiated Contact with Patient 10/14/2014 1658     Chief Complaint  Patient presents with  . Fatigue     (Consider location/radiation/quality/duration/timing/severity/associated sxs/prior Treatment) HPI Comments: 81 year old male with extensive past medical history including pancreatic cancer, hypertension, hyperlipidemia, CAD status post CABG, COPD who presents with weakness. History obtained primarily from the patient's wife and caregiver. They report that the patient has had several days of worsening generalized weakness and almost no oral intake. He has had no appetite and is so weak that he cannot sit up by himself. They have noticed that he has not urinated today. He currently denies any pain. They deny any fevers or cough. No skin changes. He was evaluated by his PCP today, who sent him in for consideration of assisted living placement as he is too sick to wait for outpatient arrangements for placement.  The history is provided by the spouse and a caregiver.    Past Medical History  Diagnosis Date  . COPD (chronic obstructive pulmonary disease)   . Arteriosclerotic cardiovascular disease (ASCVD)     H/O CHF in 1995; CABG 1988;NSTEMI in 02/2009-recurrent 3-VD, unfavorable for PCI-TO of the SVG to the OM1&2, TO SVG to the PDA and PL branch of the RCA, patent LIMA to LAD and SVG to D1 and D2; continuing exertional angina with EF40%  . Hypertension   . Hyperlipidemia   . Tobacco abuse, in remission     40-pack-year consumption  . Peptic ulcer disease 1970    Gastric ulcer; required surgical intervention  . Prostate carcinoma 2003    RT   Past Surgical History  Procedure Laterality Date  . Repair of perforated ulcer  1970    PUD  . Colonoscopy  09/27/2002    MGQ:QPYPPJ rectum and colon  . Colonoscopy  02/26/2011    RMR: Single anal papilla/Sigmoid diverticula/otherwise normal rectum. TI normal.   . Coronary  artery bypass graft  1998    x 4   . Eus N/A 07/29/2012    Dr. Ardis Hughs: Billroth II anatomy precluded complete examination (unable to visualize CBD or head of pancreas region, main pancreatic duct slightly dilated and apperated to terminate in the body of pancreas in a region of abnormal but NOT clearly mass-like parenchyma. Sampled via FNA. Atypical cells. Recommended serial imaging.    Family History  Problem Relation Age of Onset  . Coronary artery disease Mother   . Stroke Mother   . Bone cancer Father   . COPD Sister   . Coronary artery disease Sister   . Liver disease Neg Hx   . Pancreatic cancer Neg Hx   . Colon cancer Neg Hx    Social History  Substance Use Topics  . Smoking status: Former Smoker -- 1.00 packs/day for 40 years    Types: Cigarettes    Quit date: 03/10/1993  . Smokeless tobacco: Former Systems developer    Quit date: 04/05/1993  . Alcohol Use: No    Review of Systems  10 Systems reviewed and are negative for acute change except as noted in the HPI.    Allergies  Review of patient's allergies indicates no known allergies.  Home Medications   Prior to Admission medications   Medication Sig Start Date End Date Taking? Authorizing Provider  albuterol (PROVENTIL HFA;VENTOLIN HFA) 108 (90 BASE) MCG/ACT inhaler Inhale 2 puffs into the lungs every 6 (six) hours as needed for wheezing.  Historical Provider, MD  alendronate (FOSAMAX) 70 MG tablet  01/19/14   Historical Provider, MD  ALPRAZolam Duanne Moron) 0.5 MG tablet Take 0.5 mg by mouth daily as needed for sleep or anxiety.    Historical Provider, MD  aspirin EC 81 MG tablet Take 81 mg by mouth daily.    Historical Provider, MD  atorvastatin (LIPITOR) 40 MG tablet Take 40 mg by mouth daily. 05/05/14   Historical Provider, MD  dicyclomine (BENTYL) 10 MG capsule Take 10 mg by mouth QID.    Historical Provider, MD  Fluticasone-Salmeterol (ADVAIR) 250-50 MCG/DOSE AEPB Inhale 1 puff into the lungs every 12 (twelve) hours as  needed (shortness of breath).    Historical Provider, MD  furosemide (LASIX) 20 MG tablet Take 1 tablet (20 mg total) by mouth as needed. For fluid build up 10/12/14   Arnoldo Lenis, MD  KLOR-CON M10 10 MEQ tablet TAKE ONE TABLET BY MOUTH TWICE DAILY AS NEEDED - WHEN  TAKING  FLUID  PILL  LASIX  (FUROSEMIDE) 01/14/13   Arnoldo Lenis, MD  megestrol (MEGACE) 20 MG tablet Take 20 mg by mouth as needed.    Historical Provider, MD  metoprolol succinate (TOPROL XL) 25 MG 24 hr tablet Take 0.5 tablets (12.5 mg total) by mouth 2 (two) times daily. Take 0.5 (1/2 tablet) once daily 02/01/14   Arnoldo Lenis, MD  Multiple Vitamin (MULTIVITAMIN WITH MINERALS) TABS Take 1 tablet by mouth daily.    Historical Provider, MD   BP 90/69 mmHg  Pulse 97  Temp(Src) 97.7 F (36.5 C) (Oral)  Resp 18  Ht 5\' 7"  (1.702 m)  Wt 90 lb (40.824 kg)  BMI 14.09 kg/m2  SpO2 96% Physical Exam  Constitutional: No distress.  Cachectic, frail appearing man  HENT:  Head: Normocephalic and atraumatic.  dry mucous membranes  Eyes: Conjunctivae are normal. Pupils are equal, round, and reactive to light.  Neck: Neck supple.  Cardiovascular: Normal heart sounds and intact distal pulses.   No murmur heard. irregular rhythm, borderline tachycardia  Pulmonary/Chest: Effort normal and breath sounds normal.  Abdominal: Soft. Bowel sounds are normal. He exhibits no distension.  Mild midepigastric tenderness with no rebound or guarding  Musculoskeletal: He exhibits no edema.  Neurological: He is alert.  Oriented to person, difficult to understand speech  Skin: Skin is warm and dry. No rash noted.  Psychiatric: He has a normal mood and affect. Judgment normal.  Nursing note and vitals reviewed.   ED Course  Procedures (including critical care time) Labs Review Labs Reviewed  CBC WITH DIFFERENTIAL/PLATELET - Abnormal; Notable for the following:    WBC 18.2 (*)    RBC 2.53 (*)    Hemoglobin 7.7 (*)    HCT 22.4 (*)     RDW 15.7 (*)    Platelets 124 (*)    Neutrophils Relative % 93 (*)    Lymphocytes Relative 1 (*)    Neutro Abs 16.9 (*)    Lymphs Abs 0.2 (*)    Monocytes Absolute 1.1 (*)    All other components within normal limits  COMPREHENSIVE METABOLIC PANEL - Abnormal; Notable for the following:    Sodium 131 (*)    CO2 15 (*)    Glucose, Bld 358 (*)    BUN 35 (*)    Calcium 6.5 (*)    Total Protein 4.6 (*)    Albumin 1.3 (*)    AST 101 (*)    Alkaline Phosphatase 265 (*)  Total Bilirubin 2.4 (*)    All other components within normal limits  CBG MONITORING, ED - Abnormal; Notable for the following:    Glucose-Capillary 44 (*)    All other components within normal limits  CBG MONITORING, ED - Abnormal; Notable for the following:    Glucose-Capillary 32 (*)    All other components within normal limits  CBG MONITORING, ED - Abnormal; Notable for the following:    Glucose-Capillary 43 (*)    All other components within normal limits  CBG MONITORING, ED - Abnormal; Notable for the following:    Glucose-Capillary 141 (*)    All other components within normal limits    Imaging Review Dg Chest Portable 1 View  10/09/2014   CLINICAL DATA:  Weakness.  History of pancreatic cancer.  EXAM: PORTABLE CHEST - 1 VIEW  COMPARISON:  02/01/2013.  FINDINGS: Midline trachea. Cardiomegaly accentuated by AP portable technique. Prior median sternotomy. Small left pleural effusion No pneumothorax. Mild pulmonary venous congestion. Patchy bibasilar opacities, worse on the left. Surgical changes in the mid upper abdomen.  IMPRESSION: Cardiomegaly with mild pulmonary venous congestion.  Small left pleural effusion with bibasilar Airspace disease, likely atelectasis.   Electronically Signed   By: Abigail Miyamoto M.D.   On: 11/07/2014 17:38   I have personally reviewed and evaluated these lab results as part of my medical decision-making.   EKG Interpretation   Date/Time:  Friday November 03 2014 17:05:18  EDT Ventricular Rate:  94 PR Interval:  165 QRS Duration: 80 QT Interval:  396 QTC Calculation: 495 R Axis:   80 Text Interpretation:  Unknown rhythm, irregular rate Borderline low  voltage, extremity leads Borderline prolonged QT interval Baseline wander  in lead(s) V4 Atrial fibrillation Confirmed by Oren Barella MD, Kamonte Mcmichen [phone])  on 10/11/2014 5:40:54 PM       Filed Vitals:   10/20/2014 1657 11/02/2014 1700 10/29/2014 1730 10/09/2014 1830  BP: 77/57 80/51 90/56  90/69  Pulse: 86  97   Temp: 97.7 F (36.5 C)     TempSrc: Oral     Resp: 14 22 21 18   Height: 5\' 7"  (1.702 m)     Weight: 90 lb (40.824 kg)     SpO2: 97% 95% 96% 96%     MDM   Final diagnoses:  Hypoglycemia  hypotension Dehydration Chronic malnourishment 2/2 pancreatic cancer  81 year old male with terminal pancreatic cancer who presents with several days of worsening weakness. On arrival, the patient was awake, alert, cachectic and chronically ill-appearing but in no acute distress. Vital signs notable for hypertension and 77/57. Initial blood glucose was in the 40s. Patient immediately received an amp of D50 followed by a second amp after persistent hypoglycemia. Patient also received 2 L of IV fluids. Patient comfortable on exam without any focal findings with the exception of dry mucous membranes. EKG shows irregular rhythm, possible a fib but difficult to interpret given baseline wander. Chest x-ray shows cardiomegaly with mild pulmonary venous congestion with small left pleural effusion.  Obtained above lab work which is remarkable for WBC 18.2, hemoglobin 7.7, hematocrit 22.4, glucose elevated at 358 after receiving D50, sodium 131, CO2 15, BUN 35, Cr normal. Patient has no obvious signs of infection on exam and we are waiting UA results before giving antibiotics in the setting of leukocytosis. After CXR results, held on any further hydration at this time to monitor for pulmonary symptoms of volume overload. I have ordered  q2h accuchecks to monitor BG closely. Because of the  patient's hypotension, hypoglycemia likely for malnourishment, and other metabolic derangements, he will be admitted to medicine for further care.  CRITICAL CARE Performed by: Wenda Overland Kailena Lubas   Total critical care time: 35 minutes  Critical care time was exclusive of separately billable procedures and treating other patients.  Critical care was necessary to treat or prevent imminent or life-threatening deterioration.  Critical care was time spent personally by me on the following activities: development of treatment plan with patient and/or surrogate as well as nursing, discussions with consultants, evaluation of patient's response to treatment, examination of patient, obtaining history from patient or surrogate, ordering and performing treatments and interventions, ordering and review of laboratory studies, ordering and review of radiographic studies, pulse oximetry and re-evaluation of patient's condition.   Sharlett Iles, MD 10/24/2014 [phone]

## 2014-11-03 NOTE — H&P (Signed)
[facility] History and Physical  Micheal Hawkins JME:[id number] DOB: Apr 13, 1932 DOA: 10/14/2014  Referring physician: ER PCP: Leonides Grills, MD   Chief Complaint: failure to thrive  HPI: Micheal Hawkins is a 81 y.o. male  This is an unfortunate 81 year old man who has history of pancreatic cancer diagnosed approximately 8 months ago and has been treated at [facility]. According to the wife, who is at the bedside, they have been told that there is no further treatment as this is terminal pancreatic cancer. This patient has become very weak in the last 3-4 days and is unable to walk and get himself out of his own bed. He is able to somewhat eat but his oral intake is dramatically reduced. He continues to lose weight. He was evaluated by his PCP who felt he ought to go into an assisted living facility and sent him to the hospital today. The patient himself has been confused and cannot give me any clear history.on presentation to the emergency room, he was found to be hypoglycemic. He is now being admitted for further management.   Review of Systems:  Unable to obtain review of systems because of the patient's severe weakness and confusion.   Past Medical History  Diagnosis Date  . COPD (chronic obstructive pulmonary disease)   . Arteriosclerotic cardiovascular disease (ASCVD)     H/O CHF in 1995; CABG 1988;NSTEMI in 02/2009-recurrent 3-VD, unfavorable for PCI-TO of the SVG to the OM1&2, TO SVG to the PDA and PL branch of the RCA, patent LIMA to LAD and SVG to D1 and D2; continuing exertional angina with EF40%  . Hypertension   . Hyperlipidemia   . Tobacco abuse, in remission     40-pack-year consumption  . Peptic ulcer disease 1970    Gastric ulcer; required surgical intervention  . Prostate carcinoma 2003    RT   Past Surgical History  Procedure Laterality Date  . Repair of perforated ulcer  1970    PUD  . Colonoscopy  09/27/2002    IWL:[id number] rectum and colon  .  Colonoscopy  02/26/2011    RMR: Single anal papilla/Sigmoid diverticula/otherwise normal rectum. TI normal.   . Coronary artery bypass graft  1998    x 4   . Eus N/A 07/29/2012    Dr. Ardis Hughs: Billroth II anatomy precluded complete examination (unable to visualize CBD or head of pancreas region, main pancreatic duct slightly dilated and apperated to terminate in the body of pancreas in a region of abnormal but NOT clearly mass-like parenchyma. Sampled via FNA. Atypical cells. Recommended serial imaging.    Social History:  reports that he quit smoking about 21 years ago. His smoking use included Cigarettes. He has a 40 pack-year smoking history. He quit smokeless tobacco use about 21 years ago. He reports that he does not drink alcohol or use illicit drugs.  No Known Allergies  Family History  Problem Relation Age of Onset  . Coronary artery disease Mother   . Stroke Mother   . Bone cancer Father   . COPD Sister   . Coronary artery disease Sister   . Liver disease Neg Hx   . Pancreatic cancer Neg Hx   . Colon cancer Neg Hx     Prior to Admission medications   Medication Sig Start Date End Date Taking? Authorizing Provider  albuterol (PROVENTIL HFA;VENTOLIN HFA) 108 (90 BASE) MCG/ACT inhaler Inhale 2 puffs into the lungs every 6 (six) hours as needed for wheezing.   Yes  Historical Provider, MD  alendronate (FOSAMAX) 70 MG tablet Take 70 mg by mouth once a week.  01/19/14  Yes Historical Provider, MD  ALPRAZolam Duanne Moron) 0.5 MG tablet Take 0.5 mg by mouth daily as needed for sleep or anxiety.   Yes Historical Provider, MD  aspirin EC 81 MG tablet Take 81 mg by mouth daily.   Yes Historical Provider, MD  atorvastatin (LIPITOR) 40 MG tablet Take 20 mg by mouth daily.  05/05/14  Yes Historical Provider, MD  Calcium Carbonate-Vitamin D (CALCIUM-D PO) Take 1 tablet by mouth daily.   Yes Historical Provider, MD  dicyclomine (BENTYL) 10 MG capsule Take 10 mg by mouth 4 (four) times daily as needed  for spasms.    Yes Historical Provider, MD  Fluticasone-Salmeterol (ADVAIR) 250-50 MCG/DOSE AEPB Inhale 1 puff into the lungs every 12 (twelve) hours as needed (shortness of breath).   Yes Historical Provider, MD  furosemide (LASIX) 20 MG tablet Take 1 tablet (20 mg total) by mouth as needed. For fluid build up 10/12/14  Yes Arnoldo Lenis, MD  levothyroxine (SYNTHROID, LEVOTHROID) 25 MCG tablet Take 25 mcg by mouth daily before breakfast.  10/18/14  Yes Historical Provider, MD  megestrol (MEGACE) 20 MG tablet Take 20 mg by mouth every 12 (twelve) hours as needed (for appetite).    Yes Historical Provider, MD  metoprolol succinate (TOPROL XL) 25 MG 24 hr tablet Take 0.5 tablets (12.5 mg total) by mouth 2 (two) times daily. Take 0.5 (1/2 tablet) once daily 02/01/14  Yes Arnoldo Lenis, MD  Multiple Vitamin (MULTIVITAMIN WITH MINERALS) TABS Take 1 tablet by mouth daily.   Yes Historical Provider, MD  KLOR-CON M10 10 MEQ tablet TAKE ONE TABLET BY MOUTH TWICE DAILY AS NEEDED - WHEN  TAKING  FLUID  PILL  LASIX  (FUROSEMIDE) 01/14/13   Arnoldo Lenis, MD   Physical Exam: Filed Vitals:   10/18/2014 1830 10/26/2014 1900 11/02/2014 1919 10/27/2014 1942  BP: 90/69  88/66 73/51  Pulse:  111 112 88  Temp:    97.8 F (36.6 C)  TempSrc:    Oral  Resp: 18 18 24 20   Height:    5\' 7"  (1.702 m)  Weight:    47.628 kg (105 lb)  SpO2: 96% 95% 100% 97%    Wt Readings from Last 3 Encounters:  10/27/2014 47.628 kg (105 lb)  05/17/14 41.822 kg (92 lb 3.2 oz)  02/28/14 45.36 kg (100 lb)    General:  Appears extremely cachectic. He looks pale. He looks dehydrated.he looks like he has hypoactive delirium. Eyes: PERRL, normal lids, irises & conjunctiva ENT: grossly normal hearing, lips & tongue Neck: no LAD, masses or thyromegaly Cardiovascular: RRR, no m/r/g. No LE edema. Telemetry: SR, no arrhythmias  Respiratory: CTA bilaterally, no w/r/r. Normal respiratory effort. Abdomen: soft, ntnd Skin: no rash or  induration seen on limited exam Musculoskeletal: grossly normal tone BUE/BLE Psychiatric: not examined. Neurologic: grossly non-focal.          Labs on Admission:  Basic Metabolic Panel:  Recent Labs Lab 10/14/2014 1723  NA 131*  K 3.9  CL 111  CO2 15*  GLUCOSE 358*  BUN 35*  CREATININE 1.08  CALCIUM 6.5*   Liver Function Tests:  Recent Labs Lab 10/20/2014 1723  AST 101*  ALT 35  ALKPHOS 265*  BILITOT 2.4*  PROT 4.6*  ALBUMIN 1.3*   No results for input(s): LIPASE, AMYLASE in the last 168 hours. No results for input(s): AMMONIA in  the last 168 hours. CBC:  Recent Labs Lab 10/14/2014 1723  WBC 18.2*  NEUTROABS 16.9*  HGB 7.7*  HCT 22.4*  MCV 88.5  PLT 124*   Cardiac Enzymes: No results for input(s): CKTOTAL, CKMB, CKMBINDEX, TROPONINI in the last 168 hours.  BNP (last 3 results) No results for input(s): BNP in the last 8760 hours.  ProBNP (last 3 results) No results for input(s): PROBNP in the last 8760 hours.  CBG:  Recent Labs Lab 10/28/2014 1653 10/23/2014 1704 11/01/2014 1718 10/15/2014 1810  GLUCAP 44* 32* 43* 141*    Radiological Exams on Admission: Dg Chest Portable 1 View  10/27/2014   CLINICAL DATA:  Weakness.  History of pancreatic cancer.  EXAM: PORTABLE CHEST - 1 VIEW  COMPARISON:  02/01/2013.  FINDINGS: Midline trachea. Cardiomegaly accentuated by AP portable technique. Prior median sternotomy. Small left pleural effusion No pneumothorax. Mild pulmonary venous congestion. Patchy bibasilar opacities, worse on the left. Surgical changes in the mid upper abdomen.  IMPRESSION: Cardiomegaly with mild pulmonary venous congestion.  Small left pleural effusion with bibasilar Airspace disease, likely atelectasis.   Electronically Signed   By: Abigail Miyamoto M.D.   On: 10/09/2014 17:38      Assessment/Plan   1. Terminal pancreatic cancer. No further treatment for this is contemplated. His palliative performance score is 30% at best and these clearly  failing to thrive. I think his prognosis is likely 2-3 weeks and he would be appropriate for hospice home.I will refer him for hospice services. 2. Dehydration. He'll be given gentle IV fluids. 3.    Hypoglycemia. D5 normal saline fluids. 4.    Hypertension. Discontinue antihypertensives medications. 5.    Anemia of chronic disease. I would not treat this with blood transfusion at this point.  Further recommendations will depend on patient's hospital progress.   Code Status: DO NOT RESUSCITATE.   DVT Prophylaxis:none.  Family Communication: I discussed the plan with the patient's wife at the bedside. She agrees.  Disposition Plan: depending on progress. He would be appropriate for hospice home.  Time spent: 60 minutes.  Doree Albee [facility] Pager [phone].

## 2014-11-03 NOTE — Progress Notes (Signed)
Patient took mdi with spacer

## 2014-11-04 DIAGNOSIS — R627 Adult failure to thrive: Secondary | ICD-10-CM

## 2014-11-04 DIAGNOSIS — E162 Hypoglycemia, unspecified: Secondary | ICD-10-CM

## 2014-11-04 DIAGNOSIS — C259 Malignant neoplasm of pancreas, unspecified: Principal | ICD-10-CM

## 2014-11-04 DIAGNOSIS — D638 Anemia in other chronic diseases classified elsewhere: Secondary | ICD-10-CM

## 2014-11-04 LAB — COMPREHENSIVE METABOLIC PANEL
ALK PHOS: 279 U/L — AB (ref 38–126)
ALT: 38 U/L (ref 17–63)
AST: 101 U/L — ABNORMAL HIGH (ref 15–41)
Albumin: 1.5 g/dL — ABNORMAL LOW (ref 3.5–5.0)
Anion gap: 9 (ref 5–15)
BUN: 39 mg/dL — ABNORMAL HIGH (ref 6–20)
CALCIUM: 6.9 mg/dL — AB (ref 8.9–10.3)
CO2: 15 mmol/L — ABNORMAL LOW (ref 22–32)
CREATININE: 1.03 mg/dL (ref 0.61–1.24)
Chloride: 113 mmol/L — ABNORMAL HIGH (ref 101–111)
Glucose, Bld: 148 mg/dL — ABNORMAL HIGH (ref 65–99)
Potassium: 3.6 mmol/L (ref 3.5–5.1)
Sodium: 137 mmol/L (ref 135–145)
TOTAL PROTEIN: 5.1 g/dL — AB (ref 6.5–8.1)
Total Bilirubin: 2.9 mg/dL — ABNORMAL HIGH (ref 0.3–1.2)

## 2014-11-04 LAB — CBC
HCT: 28 % — ABNORMAL LOW (ref 39.0–52.0)
Hemoglobin: 9.2 g/dL — ABNORMAL LOW (ref 13.0–17.0)
MCH: 28.8 pg (ref 26.0–34.0)
MCHC: 32.9 g/dL (ref 30.0–36.0)
MCV: 87.8 fL (ref 78.0–100.0)
PLATELETS: 143 10*3/uL — AB (ref 150–400)
RBC: 3.19 MIL/uL — AB (ref 4.22–5.81)
RDW: 15.4 % (ref 11.5–15.5)
WBC: 20.5 10*3/uL — AB (ref 4.0–10.5)

## 2014-11-04 MED ORDER — ENSURE ENLIVE PO LIQD
237.0000 mL | Freq: Two times a day (BID) | ORAL | Status: DC
  Administered 2014-11-04 (×2): 237 mL via ORAL

## 2014-11-04 MED ORDER — MORPHINE SULFATE (CONCENTRATE) 10 MG/0.5ML PO SOLN: 10.0000 mg | ORAL | Status: AC | PRN

## 2014-11-04 MED ORDER — MORPHINE SULFATE (CONCENTRATE) 10 MG/0.5ML PO SOLN: 10.0000 mg | ORAL | Status: DC | PRN

## 2014-11-04 MED ORDER — ONDANSETRON HCL 4 MG PO TABS: 4.0000 mg | ORAL_TABLET | Freq: Four times a day (QID) | ORAL | Status: AC | PRN

## 2014-11-04 MED ORDER — LORAZEPAM 0.5 MG PO TABS: 0.5000 mg | ORAL_TABLET | Freq: Three times a day (TID) | ORAL | Status: AC | PRN

## 2014-11-04 NOTE — Progress Notes (Addendum)
[facility] PROGRESS NOTE  Micheal Hawkins [id number]:[id number] DOB: Apr 20, 1932 DOA: 10/20/2014 PCP: Leonides Grills, MD  Assessment/Plan: 1. Terminal pancreatic cancer. No further treatment is being pursued.  2. Dehydration. Pt received IVF. Will discontinue further IV hydration in the setting of comfort care.  3. Hypoglycemia. Resolved with dextrose infusion. 4. Hypertension. Discontinue antihypertensives medications. 5. Anemia of chronic disease. Improving since admission. No need for transfusion at this time.  6. Failure to thrive. Pt has advanced pancreatic cancer/prostate cancer and according to his wife, does not want further treatment. He is becoming increasingly cachetic and failing to thrive. His wife is unable to care for him at home. He is a candidate for residential hospice. Will consult with hospice about possible transfer depending on bed availability. Continue to focus his care on comfort. Prognosis is likely days to weeks  Code Status: DNR, comfort care DVT Prophylaxis: SCDs Family Communication: Discussed with wife over the phone. Disposition Plan: Discharge to hospice pending bed availability.    Consultants:    Procedures:    Antibiotics:    HPI/Subjective: Feeling okay. No reports of pain. Appears comfortable.  Objective: Filed Vitals:   11/04/14 0617  BP: 80/54  Pulse: 105  Temp: 97.4 F (36.3 C)  Resp: 22   No intake or output data in the 24 hours ending 11/04/14 0801 Filed Weights   11/02/2014 1657 11/01/2014 1942  Weight: 40.824 kg (90 lb) 47.628 kg (105 lb)    Exam: General:  Appears calm and comfortable Respiratory: Normal respiratory effort. Speaks in full sentences. Cardiovascular: RRR Psychiatric: grossly normal mood and affect, speech fluent and appropriate  Data Reviewed: Basic Metabolic Panel:  Recent Labs Lab 10/12/2014 1723 11/04/14 0650  NA 131* 137  K 3.9 3.6  CL 111 113*  CO2 15* 15*  GLUCOSE 358* 148*  BUN 35* 39*   CREATININE 1.08 1.03  CALCIUM 6.5* 6.9*   Liver Function Tests:  Recent Labs Lab 10/24/2014 1723 11/04/14 0650  AST 101* 101*  ALT 35 38  ALKPHOS 265* 279*  BILITOT 2.4* 2.9*  PROT 4.6* 5.1*  ALBUMIN 1.3* 1.5*   No results for input(s): LIPASE, AMYLASE in the last 168 hours. No results for input(s): AMMONIA in the last 168 hours. CBC:  Recent Labs Lab 10/13/2014 1723 11/04/14 0650  WBC 18.2* 20.5*  NEUTROABS 16.9*  --   HGB 7.7* 9.2*  HCT 22.4* 28.0*  MCV 88.5 87.8  PLT 124* 143*   Cardiac Enzymes: No results for input(s): CKTOTAL, CKMB, CKMBINDEX, TROPONINI in the last 168 hours. BNP (last 3 results) No results for input(s): BNP in the last 8760 hours.  ProBNP (last 3 results) No results for input(s): PROBNP in the last 8760 hours.  CBG:  Recent Labs Lab 10/31/2014 1653 11/04/2014 1704 10/20/2014 1718 11/06/2014 1810  GLUCAP 44* 32* 43* 141*    No results found for this or any previous visit (from the past 240 hour(s)).   Studies: Dg Chest Portable 1 View  11/02/2014   CLINICAL DATA:  Weakness.  History of pancreatic cancer.  EXAM: PORTABLE CHEST - 1 VIEW  COMPARISON:  02/01/2013.  FINDINGS: Midline trachea. Cardiomegaly accentuated by AP portable technique. Prior median sternotomy. Small left pleural effusion No pneumothorax. Mild pulmonary venous congestion. Patchy bibasilar opacities, worse on the left. Surgical changes in the mid upper abdomen.  IMPRESSION: Cardiomegaly with mild pulmonary venous congestion.  Small left pleural effusion with bibasilar Airspace disease, likely atelectasis.   Electronically Signed   By: Marylyn Ishihara  Jobe Igo M.D.   On: 11/08/2014 17:38    Scheduled Meds: . mometasone-formoterol  2 puff Inhalation BID   Continuous Infusions: . dextrose 5 % and 0.9 % NaCl with KCl 20 mEq/L 50 mL/hr at 10/25/2014 2120    Active Problems:   Hypertension   Hypoglycemia   Failure to thrive in adult   Pancreatic cancer   Anemia of chronic  disease    Time spent: 20 minutes     Kathie Dike, M.D.  [facility] Pager [phone]. If 7PM-7AM, please contact night-coverage at [email or web address], password [facility] 11/04/2014, 8:01 AM  LOS: 1 day    I, Rhett Bannister, acting a scribe, recorded this note contemporaneously in the presence of Dr. Kathie Dike, M.D. on 11/04/2014 at 8:01 AM  I have reviewed the above documentation for accuracy and completeness, and I agree with the above.  Micheal Hawkins,Micheal Hawkins

## 2014-11-04 NOTE — Progress Notes (Signed)
Still unable to pick up saturation

## 2014-11-04 NOTE — Progress Notes (Signed)
Initial Nutrition Assessment  DOCUMENTATION CODES:  Underweight  INTERVENTION:  Ensure Enlive po BID, each supplement provides 350 kcal and 20 grams of protein  NUTRITION DIAGNOSIS:  Inadequate oral intake related to cancer and cancer related treatments as evidenced by loss of 10% bw in 8 months  GOAL:  Patient will meet greater than or equal to 90% of their needs Or  Nutrition per Pt/POA desires if transitioned to [place]:  PO intake, Supplement acceptance, Weight trends, Labs, I & O's Goals/Plan of care  REASON FOR ASSESSMENT:  Malnutrition Screening Tool    ASSESSMENT:  81 y/o male PMHx COPD, CVD, HLD, HTN, PUD and pancreatic cancer. Presents with weakness in last 3-4 days and is unable to walk/get out of bed. Oral intake is reduced and continues losing weight. Pt being referred to hospice services.   RD operating remotely. Per notes:   Patient with terminal pancreatic cancer and MD reports pt FTT. He is dehydrated. Pt with almost no intake in several days and has no appetite.  Pt appears to have lost 10 lbs (10% ) bw in the past 8 months. This is not significant.   Due to being unable to physically assess pt and not having significant weight loss, unable to dx with severe malnutrition, however given his BMI he most likely meets criteria.   Diet Order:  Diet regular Room service appropriate?: Yes; Fluid consistency:: Thin  Skin:  Reviewed, no issues  Last BM:  8/26  Height:  Ht Readings from Last 1 Encounters:  10/28/2014 5\' 7"  (1.702 m)   Weight:  Wt Readings from Last 1 Encounters:  11/08/2014 105 lb (47.628 kg)   Wt Readings from Last 10 Encounters:  11/04/2014 105 lb (47.628 kg)  05/17/14 92 lb 3.2 oz (41.822 kg)  02/28/14 100 lb (45.36 kg)  02/01/14 103 lb (46.72 kg)  10/03/13 107 lb (48.535 kg)  07/05/13 109 lb (49.442 kg)  07/20/12 121 lb 6.4 oz (55.067 kg)  06/08/12 125 lb 12.8 oz (57.063 kg)  05/29/11 140 lb (63.504 kg)  02/26/11 140 lb  (63.504 kg)  Admit weight on 8/26: 90 lbs  Ideal Body Weight:  67.3 kg  BMI:  Body mass index is 16.44 kg/(m^2).  Using admit weight: 14.2  Estimated Nutritional Needs:  Kcal:  1400-1650 kcals (35-40 kcal/kg) Protein:  62-82 g (1.5-2 g/kg bw) Fluid:  1.4-1.7 liters  EDUCATION NEEDS:  No education needs identified at this time  Burtis Junes RD, LDN Nutrition Pager: [phone] 11/04/2014 8:14 AM

## 2014-11-05 DIAGNOSIS — L8990 Pressure ulcer of unspecified site, unspecified stage: Secondary | ICD-10-CM | POA: Insufficient documentation

## 2014-11-09 NOTE — Discharge Summary (Addendum)
Death Summary  Micheal Hawkins [id number]:[id number] DOB: 1933-01-04 DOA: 08-Nov-2014  PCP: Leonides Grills, MD  Admit date: 11/08/14 Date of Death: 11/10/14  Final Diagnoses:  Active Problems:   Hypertension   Hypoglycemia   Failure to thrive in adult   Pancreatic cancer with possible metastasis to lungs   Anemia of chronic disease   Right Back Stage 1 Pressure ulcer, present on admission   History of present illness:  This is an unfortunate 81 year old man with a history of metastatic pancreatic cancer which was diagnosed approximately 8 months ago. He was treated at [facility]. According to his wife, they were told that there was no further treatment as this is terminal pancreatic cancer. Patient had been becoming very weak in the last 3-4 days prior to admission and was unable to walk. His wife was unable to care for him. His oral intake had significantly declined and he was losing weight. He was brought to the ER for evaluation  Hospital Course:  Patient's wife reported that the patient would not want further treatment for his underlying cancers. Since he was clearly failing to thrive and declining, they inquired about hospice services. Patient was admitted to the hospital for comfort care. He was treated with morphine for pain and Ativan for anxiety. Based on his terminal cancer, severe malnutrition, failure to thrive and weight loss, he certainly qualifies for residential hospice placement. Plan was to transfer the patient to residential hospice once a bed was available. Unfortunately, overnight the patient quickly declined. He became unresponsive, hypotensive and developed cyanosis in his extremities. He began to have agonal breathing. Shortly thereafter, he was pronounced dead by staff at 17:45. Family was at the bedside.   Time: 64mins  Signed:  Hulbert Branscome  [facility] November 10, 2014, 6:12 PM

## 2014-11-09 NOTE — Progress Notes (Signed)
Patient 02 Sats reading 80 percent.  Patients hands are cold to touch.  Patient shows no signs of shortness of breath.  Patient placed on 2L O2 nasal via nasal cannula.

## 2014-11-09 NOTE — Progress Notes (Signed)
Unable to  rouse patient to take MDI.  Unable to obtain SpO2, hands cold to touch. Oxygen increased to 4L

## 2014-11-09 NOTE — Progress Notes (Signed)
[facility] PROGRESS NOTE  Micheal Hawkins [id number] DOB: 12-07-1932 DOA: 10/13/2014 PCP: Leonides Grills, MD  Assessment/Plan: 1. Terminal pancreatic cancer. No further treatment is being pursued.  2. Dehydration. Pt had received IVF but then fluids were discontinued in the setting of comfort care.  3. Hypoglycemia. Resolved with dextrose infusion. 4. Hypertension. Discontinue antihypertensives medications. 5. Anemia of chronic disease. Improving since admission. No need for transfusion at this time.  6. Failure to thrive. Pt has advanced pancreatic cancer/prostate cancer and according to his wife, does not want further treatment. He is becoming increasingly cachetic and failing to thrive. His wife is unable to care for him at home. He is a candidate for residential hospice. Unfortunately overnight is his clinical condition has deteriorated. Expected prognosis at this point is hours. His wife was contacted and made aware. Will continue comfort care. Anticipate in-hospital death.  Code Status: DNR, comfort care DVT Prophylaxis: SCDs Family Communication: Discussed with wife over the phone. Disposition Plan: Anticipate in-hospital death.    Consultants:    Procedures:    Antibiotics:    HPI/Subjective: Per nurse preacher tried to communicate with pt but was unsuccessful. Pt is unresponsive.  Objective: Filed Vitals:   2014/11/24 0615  BP: 72/39  Pulse:   Temp:   Resp:     Intake/Output Summary (Last 24 hours) at Nov 24, 2014 0659 Last data filed at 11/04/14 1950  Gross per 24 hour  Intake    240 ml  Output     80 ml  Net    160 ml   Filed Weights   10/16/2014 1657 10/20/2014 1942  Weight: 40.824 kg (90 lb) 47.628 kg (105 lb)    Exam: General:  Pt is unresponsive. Appears to be agonal breathing. Respiratory: Clear bilaterally.  Cardiovascular: Regular Rate and Rhythm. Skin: Tips of fingers and toes are cyanotic.  Data Reviewed: Basic Metabolic  Panel:  Recent Labs Lab 10/23/2014 1723 11/04/14 0650  NA 131* 137  K 3.9 3.6  CL 111 113*  CO2 15* 15*  GLUCOSE 358* 148*  BUN 35* 39*  CREATININE 1.08 1.03  CALCIUM 6.5* 6.9*   Liver Function Tests:  Recent Labs Lab 10/26/2014 1723 11/04/14 0650  AST 101* 101*  ALT 35 38  ALKPHOS 265* 279*  BILITOT 2.4* 2.9*  PROT 4.6* 5.1*  ALBUMIN 1.3* 1.5*   No results for input(s): LIPASE, AMYLASE in the last 168 hours. No results for input(s): AMMONIA in the last 168 hours. CBC:  Recent Labs Lab 11/06/2014 1723 11/04/14 0650  WBC 18.2* 20.5*  NEUTROABS 16.9*  --   HGB 7.7* 9.2*  HCT 22.4* 28.0*  MCV 88.5 87.8  PLT 124* 143*   Cardiac Enzymes: No results for input(s): CKTOTAL, CKMB, CKMBINDEX, TROPONINI in the last 168 hours. BNP (last 3 results) No results for input(s): BNP in the last 8760 hours.  ProBNP (last 3 results) No results for input(s): PROBNP in the last 8760 hours.  CBG:  Recent Labs Lab 11/07/2014 1653 10/25/2014 1704 11/07/2014 1718 10/12/2014 1810  GLUCAP 44* 32* 43* 141*    No results found for this or any previous visit (from the past 240 hour(s)).   Studies: Dg Chest Portable 1 View  10/10/2014   CLINICAL DATA:  Weakness.  History of pancreatic cancer.  EXAM: PORTABLE CHEST - 1 VIEW  COMPARISON:  02/01/2013.  FINDINGS: Midline trachea. Cardiomegaly accentuated by AP portable technique. Prior median sternotomy. Small left pleural effusion No pneumothorax. Mild pulmonary venous congestion. Patchy bibasilar opacities,  worse on the left. Surgical changes in the mid upper abdomen.  IMPRESSION: Cardiomegaly with mild pulmonary venous congestion.  Small left pleural effusion with bibasilar Airspace disease, likely atelectasis.   Electronically Signed   By: Abigail Miyamoto M.D.   On: 11/04/2014 17:38    Scheduled Meds: . feeding supplement (ENSURE ENLIVE)  237 mL Oral BID BM  . mometasone-formoterol  2 puff Inhalation BID   Continuous Infusions:    Active  Problems:   Hypertension   Hypoglycemia   Failure to thrive in adult   Pancreatic cancer   Anemia of chronic disease    Time spent: 25 minutes     Kathie Dike, M.D.  [facility] Pager [phone]. If 7PM-7AM, please contact night-coverage at [email or web address], password [facility] November 15, 2014, 6:59 AM  LOS: 2 days    I, Rhett Bannister, acting a scribe, recorded this note contemporaneously in the presence of Dr. Kathie Dike, M.D. on 11-15-2014 at 6:59 AM   I have reviewed the above documentation for accuracy and completeness, and I agree with the above.  MEMON,JEHANZEB

## 2014-11-09 NOTE — Progress Notes (Signed)
Utilization review Completed Rivkah Wolz RN BSN   

## 2014-11-09 NOTE — Clinical Documentation Improvement (Signed)
Hospitalist  Can the diagnosis of confusion/delirium be further specified?   Encephalopathy, toxic, metabolic, other   Other  Clinically Undetermined  Supporting Information: -- FTT, cachectic with BMI 14, terminal pancreatic cancer, bed bound   Please exercise your independent, professional judgment when responding. A specific answer is not anticipated or expected.   Thank You,  [place]

## 2014-11-09 NOTE — Progress Notes (Signed)
Staff called into room, patient without respirations or pulse,time of death called. Family at bedside, MD informed and [facility] called.

## 2014-11-09 DEATH — deceased

## 2016-08-23 IMAGING — CT CT ABD-PEL WO/W CM
2 of 9 series · 13 of 46 positions shown, 15 images · IV contrast (Omnipaque 300)
Comparison: 10/11/2012

CLINICAL DATA: Jaundice.

EXAM:
CT ABDOMEN AND PELVIS WITHOUT AND WITH CONTRAST
TECHNIQUE: Multidetector CT imaging of the abdomen and pelvis was performed
following the standard protocol before and following the bolus
administration of intravenous contrast.
CONTRAST:  100 mL Omnipaque 300

[Series 4: mpr arterial cor (id) · coronal · arterial · 0.48mm/px · 3 of 72 slices shown]
[im 18/72  soft-tissue]
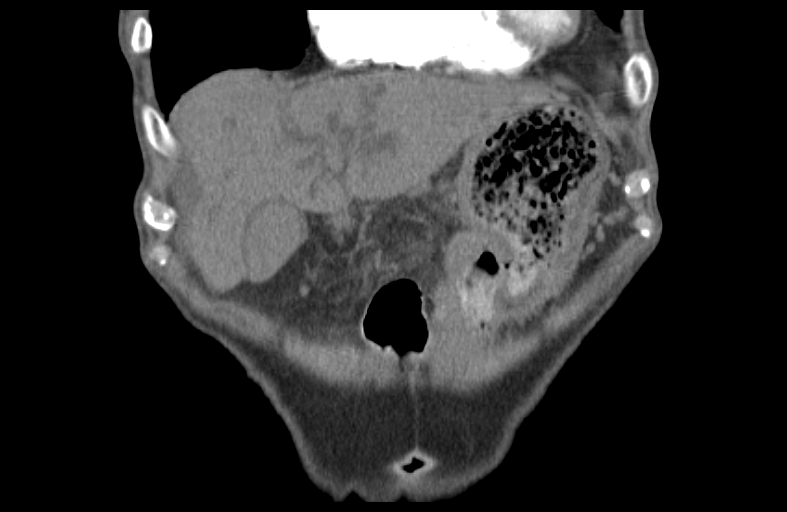
[im 36/72  soft-tissue]
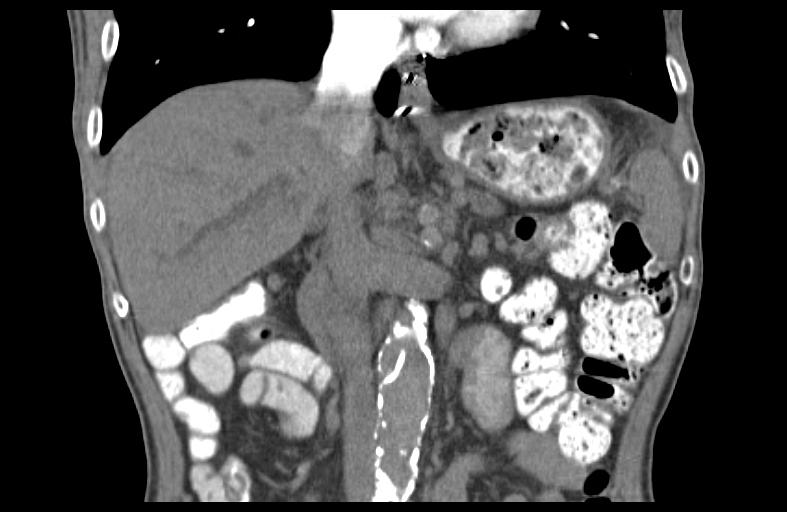
[im 54/72  soft-tissue]
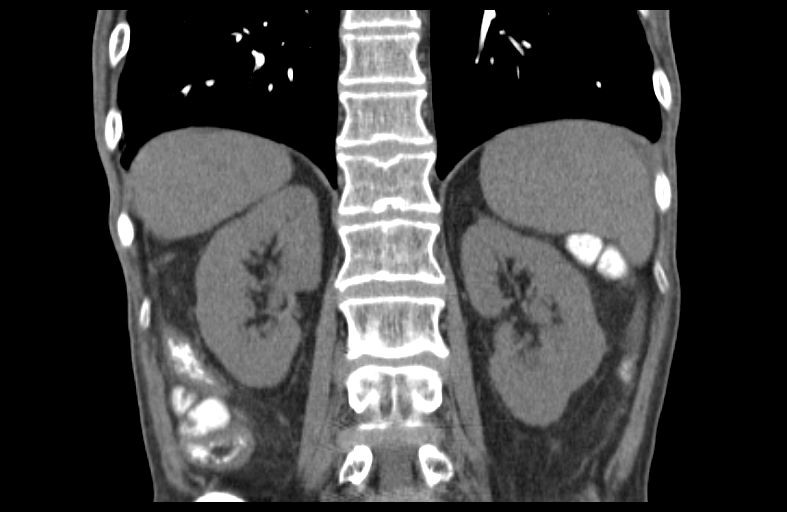

[Series 7: venous 60 sec 3.0 b40f · axial · portal-venous · 0.74mm/px · z∈[-548,-182]mm · 10 of 150 slices shown, 12 images]
[im 14/150  soft-tissue]
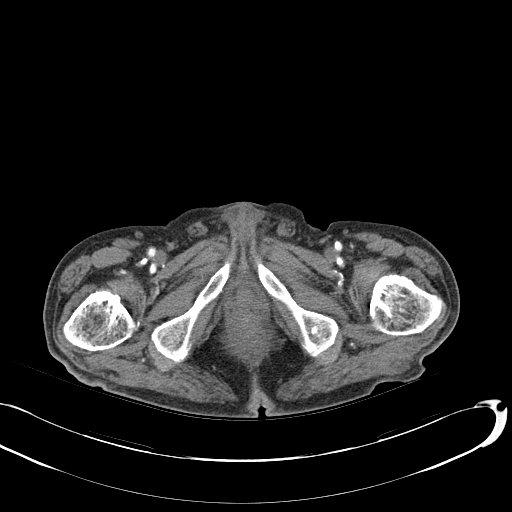
[im 14/150  bone]
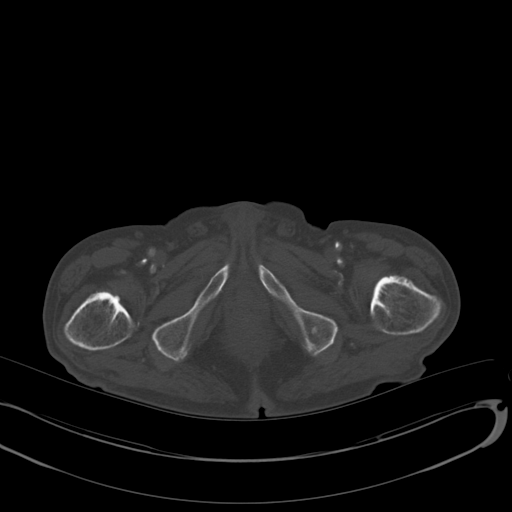
[im 28/150  soft-tissue]
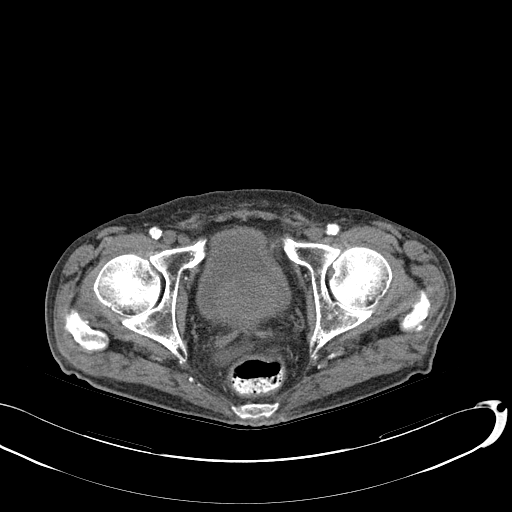
[im 41/150  soft-tissue]
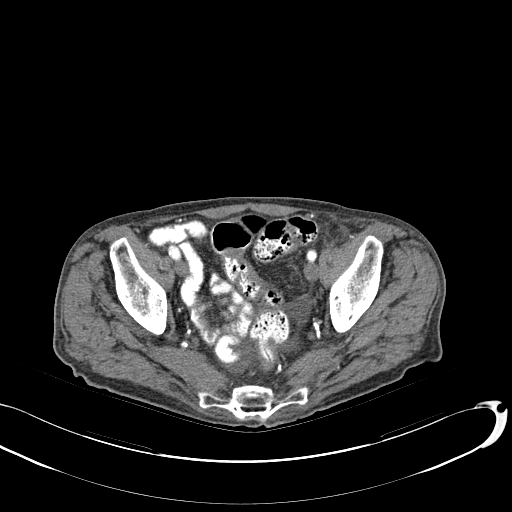
[im 55/150  soft-tissue]
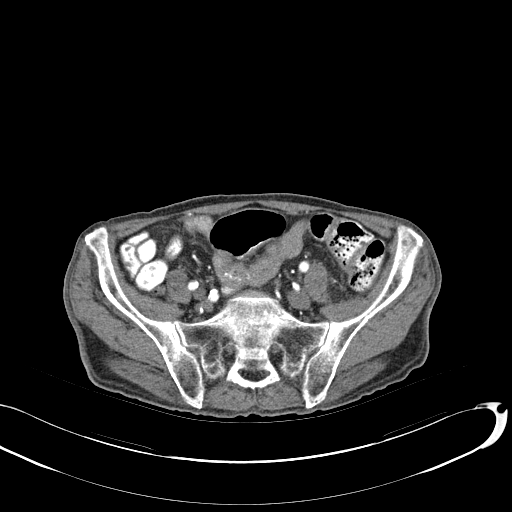
[im 68/150  soft-tissue]
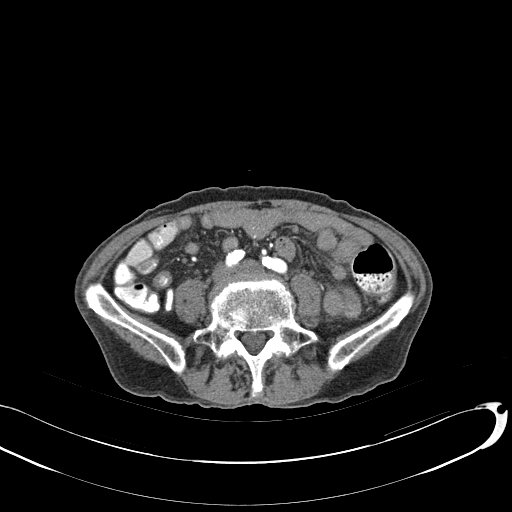
[im 82/150  soft-tissue]
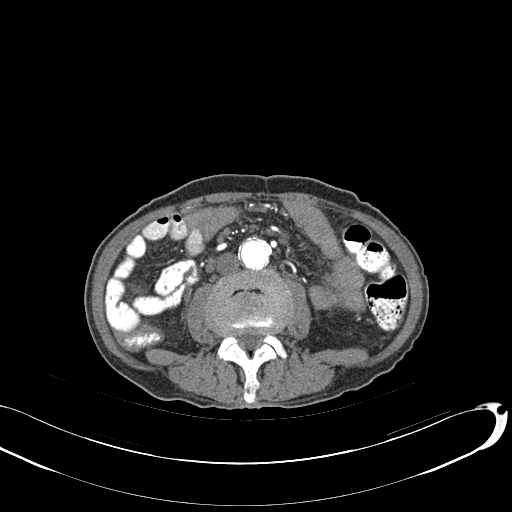
[im 95/150  soft-tissue]
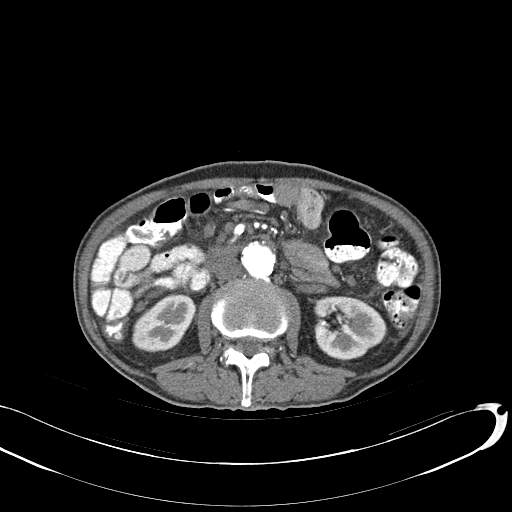
[im 109/150  soft-tissue]
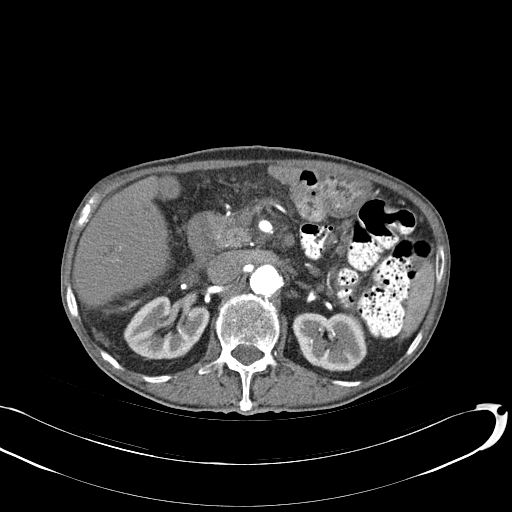
[im 122/150  soft-tissue]
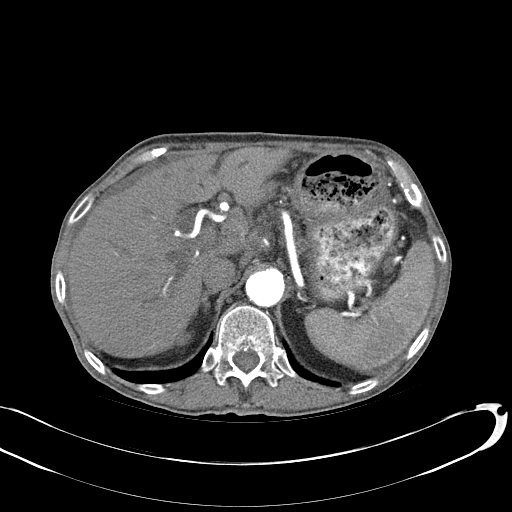
[im 122/150  bone]
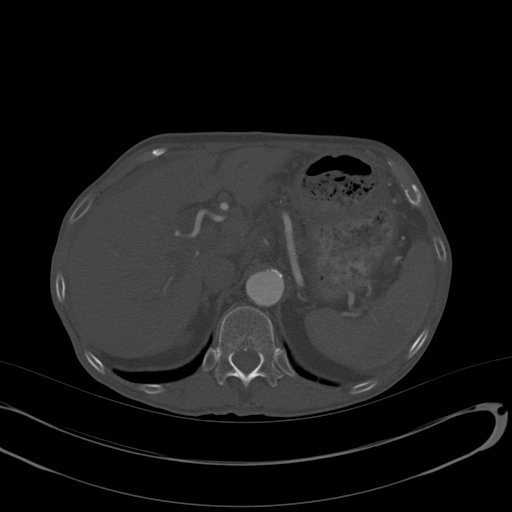
[im 136/150  soft-tissue]
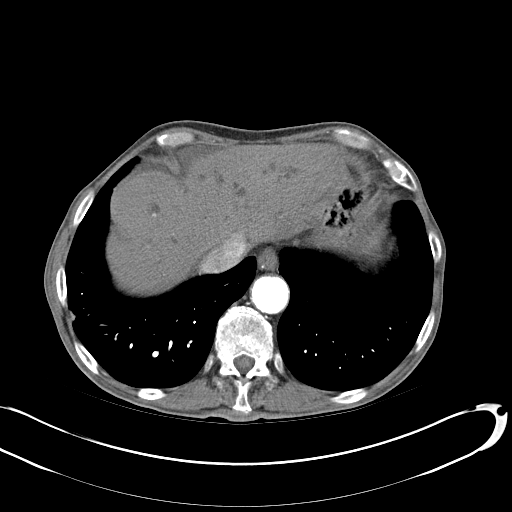

[13 of 46 positions shown; findings below may reference images not displayed]

FINDINGS: Lower chest: A new spiculated nodular density is seen in the lateral
right lung base on image 17 which measures 1.2 cm. This could
represent bronchogenic carcinoma or infectious or inflammatory
etiology.

Hepatobiliary: Diffuse biliary ductal dilatation is new since
previous study extends to the pancreatic head. No liver masses are
identified. Small calcified gallstones noted, without evidence of
acute cholecystitis.

Pancreas: Diffuse pancreatic ductal dilatation atrophy is again
demonstrated. An ill-defined areas soft tissue prominence is noted
the pancreatic head measures 1.6 x 2.5 cm, image 44/series 12. This
is suspicious for small pancreatic carcinoma. No definite evidence
of celiac axis or superior mesenteric artery involvement.

Spleen:  Within normal limits in size and appearance.

Adrenal Glands:  No masses identified.

Kidneys/Urinary Tract: No evidence of urolithiasis or
hydronephrosis. No complex cystic or solid renal masses identified.
No masses seen involving lower urinary tract.

Stomach/Bowel/Peritoneum: No evidence of wall thickening, mass, or
obstruction. Small amount of pelvic ascites noted.

Vascular/Lymphatic: Mild peripancreatic and porta hepatis
lymphadenopathy is seen, with largest lymph node of the porta
hepatis measuring 1.8 cm on image 30/series 12. Numerous tiny less
than 5 mm lymph nodes are also seen within the central small bowel
mesentery.

Reproductive:  Moderately enlarged prostate noted.

Other:  None.

Musculoskeletal:  No suspicious bone lesions identified.
IMPRESSION: New diffuse biliary ductal dilatation and chronic diffuse pancreatic
ductal dilatation.

Ill-defined soft tissue prominence in the pancreatic head,
suspicious for small pancreatic carcinoma. Endoscopic ultrasound
suggested for further evaluation.

Mild peripancreatic and porta hepatis lymphadenopathy, suspicious
for metastatic disease. No liver metastases identified.

New 12 mm spiculated nodule in lateral right lung base. Differential
diagnosis includes bronchogenic carcinoma and
infectious/inflammatory etiologies. Consider chest CT or PET-CT for
further evaluation.

Moderately enlarged prostate.  Mild pelvic ascites.

Cholelithiasis.  No radiographic evidence of cholecystitis.
